# Patient Record
Sex: Female | Born: 1985 | Race: White | Hispanic: No | Marital: Single | Smoking: Never smoker
Health system: Southern US, Community
[De-identification: ages and names within clinical notes are randomized; demographics above are authoritative.]

## PROBLEM LIST (undated history)

## (undated) DIAGNOSIS — E785 Hyperlipidemia, unspecified: Secondary | ICD-10-CM

## (undated) HISTORY — PX: WISDOM TOOTH EXTRACTION: SHX21

## (undated) HISTORY — DX: Hyperlipidemia, unspecified: E78.5

## (undated) HISTORY — PX: OTHER SURGICAL HISTORY: SHX169

---
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
## 2004-03-16 ENCOUNTER — Emergency Department (HOSPITAL_COMMUNITY): Admission: EM | Admit: 2004-03-16 | Discharge: 2004-03-17 | Payer: Self-pay | Admitting: Emergency Medicine

## 2004-03-24 ENCOUNTER — Ambulatory Visit (HOSPITAL_COMMUNITY): Admission: RE | Admit: 2004-03-24 | Discharge: 2004-03-24 | Payer: Self-pay | Admitting: Family Medicine

## 2004-03-31 ENCOUNTER — Ambulatory Visit (HOSPITAL_COMMUNITY): Admission: RE | Admit: 2004-03-31 | Discharge: 2004-03-31 | Payer: Self-pay | Admitting: Family Medicine

## 2004-05-19 ENCOUNTER — Ambulatory Visit: Payer: Self-pay | Admitting: Family Medicine

## 2005-07-21 ENCOUNTER — Ambulatory Visit: Payer: Self-pay | Admitting: Family Medicine

## 2007-05-31 ENCOUNTER — Encounter: Payer: Self-pay | Admitting: Family Medicine

## 2010-06-29 NOTE — Letter (Signed)
Summary: RPC chart  RPC chart   Imported By: Curtis Sites 12/17/2009 10:11:19  _____________________________________________________________________  External Attachment:    Type:   Image     Comment:   External Document

## 2012-12-19 ENCOUNTER — Ambulatory Visit: Payer: Self-pay | Admitting: Obstetrics & Gynecology

## 2013-01-08 ENCOUNTER — Encounter: Payer: Self-pay | Admitting: *Deleted

## 2013-01-08 ENCOUNTER — Ambulatory Visit: Payer: Self-pay | Admitting: Obstetrics & Gynecology

## 2013-01-17 ENCOUNTER — Ambulatory Visit: Payer: Self-pay | Admitting: General Practice

## 2013-01-17 ENCOUNTER — Encounter: Payer: Self-pay | Admitting: General Practice

## 2013-01-17 VITALS — BP 110/73 | HR 88 | Temp 99.1°F | Ht 63.0 in | Wt 177.0 lb

## 2013-01-17 DIAGNOSIS — A499 Bacterial infection, unspecified: Secondary | ICD-10-CM

## 2013-01-17 DIAGNOSIS — B9689 Other specified bacterial agents as the cause of diseases classified elsewhere: Secondary | ICD-10-CM

## 2013-01-17 DIAGNOSIS — N760 Acute vaginitis: Secondary | ICD-10-CM

## 2013-01-17 DIAGNOSIS — L299 Pruritus, unspecified: Secondary | ICD-10-CM

## 2013-01-17 LAB — POCT WET PREP WITH KOH
Trichomonas, UA: NEGATIVE
WBC Wet Prep HPF POC: NEGATIVE
Yeast Wet Prep HPF POC: NEGATIVE

## 2013-01-17 MED ORDER — FLUCONAZOLE 150 MG PO TABS: 150.0000 mg | ORAL_TABLET | Freq: Once | ORAL | Status: DC

## 2013-01-17 MED ORDER — METRONIDAZOLE 500 MG PO TABS: 500.0000 mg | ORAL_TABLET | Freq: Two times a day (BID) | ORAL | Status: DC

## 2013-01-17 NOTE — Progress Notes (Signed)
  Subjective:    Patient ID: Burman Nieves, female    DOB: 08-Nov-1985, 27 y.o.   MRN: [id number]  Vaginal Discharge The patient's primary symptoms include genital itching and a vaginal discharge. The patient's pertinent negatives include no genital lesions, genital rash, missed menses, pelvic pain or vaginal bleeding. This is a new problem. The current episode started in the past 7 days. The problem occurs constantly. The problem has been gradually worsening. The problem affects both sides. She is not pregnant. Pertinent negatives include no abdominal pain, back pain, chills, constipation, dysuria, fever, flank pain, frequency, headaches, hematuria, nausea or rash. The vaginal discharge was yellow, white and thick. There has been no bleeding. She has not been passing clots. She has not been passing tissue. Nothing aggravates the symptoms. She has tried nothing for the symptoms. She is sexually active. No, her partner does not have an STD. She uses nothing for contraception. Her menstrual history has been irregular. There is no history of an abdominal surgery, miscarriage, PID or an STD.   Patient presents today with complaints of vaginal discharge. She reports last menstrual cycle was December 14, 2012.     Review of Systems  Constitutional: Negative for fever and chills.  Respiratory: Negative for chest tightness and shortness of breath.   Cardiovascular: Negative for chest pain and palpitations.  Gastrointestinal: Negative for nausea, abdominal pain and constipation.  Genitourinary: Positive for vaginal discharge. Negative for dysuria, frequency, hematuria, flank pain, difficulty urinating, pelvic pain and missed menses.  Musculoskeletal: Negative for back pain.  Skin: Negative for rash.  Neurological: Negative for dizziness, weakness and headaches.       Objective:   Physical Exam  Constitutional: She is oriented to person, place, and time. She appears well-developed and well-nourished.   Cardiovascular: Normal rate, regular rhythm and normal heart sounds.   Pulmonary/Chest: Effort normal and breath sounds normal.  Abdominal: Soft. Bowel sounds are normal. She exhibits no distension. There is no tenderness.  Neurological: She is alert and oriented to person, place, and time.  Skin: Skin is warm and dry.  Psychiatric: She has a normal mood and affect.   Results for orders placed in visit on 01/17/13  POCT WET PREP WITH KOH      Result Value Range   Trichomonas, UA Negative     Clue Cells Wet Prep HPF POC occ     Epithelial Wet Prep HPF POC many     Yeast Wet Prep HPF POC neg     Bacteria Wet Prep HPF POC mod     RBC Wet Prep HPF POC occ     WBC Wet Prep HPF POC neg           Assessment & Plan:  1. Itching - POCT Wet Prep with KOH  2. Bacterial vaginal infection - metroNIDAZOLE (FLAGYL) 500 MG tablet; Take 1 tablet (500 mg total) by mouth 2 (two) times daily.  Dispense: 14 tablet; Refill: 0  3. Vaginitis and vulvovaginitis, unspecified - fluconazole (DIFLUCAN) 150 MG tablet; Take 1 tablet (150 mg total) by mouth once. May repeat in 3 days  Dispense: 3 tablet; Refill: 0 -discussed proper perineal hygiene -RTO if symptoms worsen or unresolved  -Patient verbalized understanding -Coralie Keens, FNP-C

## 2013-01-17 NOTE — Patient Instructions (Addendum)
Bacterial Vaginosis Bacterial vaginosis (BV) is a vaginal infection where the normal balance of bacteria in the vagina is disrupted. The normal balance is then replaced by an overgrowth of certain bacteria. There are several different kinds of bacteria that can cause BV. BV is the most common vaginal infection in women of childbearing age. CAUSES   The cause of BV is not fully understood. BV develops when there is an increase or imbalance of harmful bacteria.  Some activities or behaviors can upset the normal balance of bacteria in the vagina and put women at increased risk including:  Having a new sex partner or multiple sex partners.  Douching.  Using an intrauterine device (IUD) for contraception.  It is not clear what role sexual activity plays in the development of BV. However, women that have never had sexual intercourse are rarely infected with BV. Women do not get BV from toilet seats, bedding, swimming pools or from touching objects around them.  SYMPTOMS   Grey vaginal discharge.  A fish-like odor with discharge, especially after sexual intercourse.  Itching or burning of the vagina and vulva.  Burning or pain with urination.  Some women have no signs or symptoms at all. DIAGNOSIS  Your caregiver must examine the vagina for signs of BV. Your caregiver will perform lab tests and look at the sample of vaginal fluid through a microscope. They will look for bacteria and abnormal cells (clue cells), a pH test higher than 4.5, and a positive amine test all associated with BV.  RISKS AND COMPLICATIONS   Pelvic inflammatory disease (PID).  Infections following gynecology surgery.  Developing HIV.  Developing herpes virus. TREATMENT  Sometimes BV will clear up without treatment. However, all women with symptoms of BV should be treated to avoid complications, especially if gynecology surgery is planned. Female partners generally do not need to be treated. However, BV may spread  between female sex partners so treatment is helpful in preventing a recurrence of BV.   BV may be treated with antibiotics. The antibiotics come in either pill or vaginal cream forms. Either can be used with nonpregnant or pregnant women, but the recommended dosages differ. These antibiotics are not harmful to the baby.  BV can recur after treatment. If this happens, a second round of antibiotics will often be prescribed.  Treatment is important for pregnant women. If not treated, BV can cause a premature delivery, especially for a pregnant woman who had a premature birth in the past. All pregnant women who have symptoms of BV should be checked and treated.  For chronic reoccurrence of BV, treatment with a type of prescribed gel vaginally twice a week is helpful. HOME CARE INSTRUCTIONS   Finish all medication as directed by your caregiver.  Do not have sex until treatment is completed.  Tell your sexual partner that you have a vaginal infection. They should see their caregiver and be treated if they have problems, such as a mild rash or itching.  Practice safe sex. Use condoms. Only have 1 sex partner. PREVENTION  Basic prevention steps can help reduce the risk of upsetting the natural balance of bacteria in the vagina and developing BV:  Do not have sexual intercourse (be abstinent).  Do not douche.  Use all of the medicine prescribed for treatment of BV, even if the signs and symptoms go away.  Tell your sex partner if you have BV. That way, they can be treated, if needed, to prevent reoccurrence. SEEK MEDICAL CARE IF:     Your symptoms are not improving after 3 days of treatment.  You have increased discharge, pain, or fever. MAKE SURE YOU:   Understand these instructions.  Will watch your condition.  Will get help right away if you are not doing well or get worse. FOR MORE INFORMATION  Division of STD Prevention (DSTDP), Centers for Disease Control and Prevention:  www.cdc.gov/std American [employer] Health Association (ASHA): www.ashastd.org  Document Released: 05/16/2005 Document Revised: 08/08/2011 Document Reviewed: 11/06/2008 [employer] Patient Information 2014 [employer].  

## 2013-05-29 ENCOUNTER — Telehealth: Payer: Self-pay | Admitting: Nurse Practitioner

## 2013-05-29 MED ORDER — FLUCONAZOLE 150 MG PO TABS: 150.0000 mg | ORAL_TABLET | Freq: Once | ORAL | Status: DC

## 2013-05-29 NOTE — Telephone Encounter (Signed)
Done

## 2013-10-09 ENCOUNTER — Other Ambulatory Visit: Payer: Self-pay | Admitting: Nurse Practitioner

## 2013-12-10 ENCOUNTER — Encounter (INDEPENDENT_AMBULATORY_CARE_PROVIDER_SITE_OTHER): Payer: Self-pay

## 2013-12-10 ENCOUNTER — Encounter: Payer: Self-pay | Admitting: Family

## 2013-12-10 ENCOUNTER — Ambulatory Visit (INDEPENDENT_AMBULATORY_CARE_PROVIDER_SITE_OTHER): Payer: BC Managed Care – PPO | Admitting: Family

## 2013-12-10 VITALS — BP 116/76 | HR 85 | Temp 97.7°F | Ht 63.0 in | Wt 189.2 lb

## 2013-12-10 DIAGNOSIS — Z23 Encounter for immunization: Secondary | ICD-10-CM

## 2013-12-10 DIAGNOSIS — Z01419 Encounter for gynecological examination (general) (routine) without abnormal findings: Secondary | ICD-10-CM

## 2013-12-10 DIAGNOSIS — R5383 Other fatigue: Secondary | ICD-10-CM

## 2013-12-10 DIAGNOSIS — R5381 Other malaise: Secondary | ICD-10-CM

## 2013-12-10 DIAGNOSIS — Z124 Encounter for screening for malignant neoplasm of cervix: Secondary | ICD-10-CM

## 2013-12-10 DIAGNOSIS — E785 Hyperlipidemia, unspecified: Secondary | ICD-10-CM | POA: Insufficient documentation

## 2013-12-10 NOTE — Patient Instructions (Addendum)
Health Maintenance, Female A healthy lifestyle and preventative care can promote health and wellness.  Maintain regular health, dental, and eye exams.  Eat a healthy diet. Foods like vegetables, fruits, whole grains, low-fat dairy products, and lean protein foods contain the nutrients you need without too many calories. Decrease your intake of foods high in solid fats, added sugars, and salt. Get information about a proper diet from your caregiver, if necessary.  Regular physical exercise is one of the most important things you can do for your health. Most adults should get at least 150 minutes of moderate-intensity exercise (any activity that increases your heart rate and causes you to sweat) each week. In addition, most adults need muscle-strengthening exercises on 2 or more days a week.   Maintain a healthy weight. The body mass index (BMI) is a screening tool to identify possible weight problems. It provides an estimate of body fat based on height and weight. Your caregiver can help determine your BMI, and can help you achieve or maintain a healthy weight. For adults 20 years and older:  A BMI below 18.5 is considered underweight.  A BMI of 18.5 to 24.9 is normal.  A BMI of 25 to 29.9 is considered overweight.  A BMI of 30 and above is considered obese.  Maintain normal blood lipids and cholesterol by exercising and minimizing your intake of saturated fat. Eat a balanced diet with plenty of fruits and vegetables. Blood tests for lipids and cholesterol should begin at age 41 and be repeated every 5 years. If your lipid or cholesterol levels are high, you are over 50, or you are a high risk for heart disease, you may need your cholesterol levels checked more frequently.Ongoing high lipid and cholesterol levels should be treated with medicines if diet and exercise are not effective.  If you smoke, find out from your caregiver how to quit. If you do not use tobacco, do not start.  Lung  cancer screening is recommended for adults aged 66-80 years who are at high risk for developing lung cancer because of a history of smoking. Yearly low-dose computed tomography (CT) is recommended for people who have at least a 30-pack-year history of smoking and are a current smoker or have quit within the past 15 years. A pack year of smoking is smoking an average of 1 pack of cigarettes a day for 1 year (for example: 1 pack a day for 30 years or 2 packs a day for 15 years). Yearly screening should continue until the smoker has stopped smoking for at least 15 years. Yearly screening should also be stopped for people who develop a health problem that would prevent them from having lung cancer treatment.  If you are pregnant, do not drink alcohol. If you are breastfeeding, be very cautious about drinking alcohol. If you are not pregnant and choose to drink alcohol, do not exceed 1 drink per day. One drink is considered to be 12 ounces (355 mL) of beer, 5 ounces (148 mL) of wine, or 1.5 ounces (44 mL) of liquor.  Avoid use of street drugs. Do not share needles with anyone. Ask for help if you need support or instructions about stopping the use of drugs.  High blood pressure causes heart disease and increases the risk of stroke. Blood pressure should be checked at least every 1 to 2 years. Ongoing high blood pressure should be treated with medicines, if weight loss and exercise are not effective.  If you are 29 to 29  years old, ask your caregiver if you should take aspirin to prevent strokes.  Diabetes screening involves taking a blood sample to check your fasting blood sugar level. This should be done once every 3 years, after age 29, if you are within normal weight and without risk factors for diabetes. Testing should be considered at a younger age or be carried out more frequently if you are overweight and have at least 1 risk factor for diabetes.  Breast cancer screening is essential preventative care  for women. You should practice "breast self-awareness." This means understanding the normal appearance and feel of your breasts and may include breast self-examination. Any changes detected, no matter how small, should be reported to a caregiver. Women in their 20s and 30s should have a clinical breast exam (CBE) by a caregiver as part of a regular health exam every 1 to 3 years. After age 40, women should have a CBE every year. Starting at age 40, women should consider having a mammogram (breast X-ray) every year. Women who have a family history of breast cancer should talk to their caregiver about genetic screening. Women at a high risk of breast cancer should talk to their caregiver about having an MRI and a mammogram every year.  Breast cancer gene (BRCA)-related cancer risk assessment is recommended for women who have family members with BRCA-related cancers. BRCA-related cancers include breast, ovarian, tubal, and peritoneal cancers. Having family members with these cancers may be associated with an increased risk for harmful changes (mutations) in the breast cancer genes BRCA1 and BRCA2. Results of the assessment will determine the need for genetic counseling and BRCA1 and BRCA2 testing.  The Pap test is a screening test for cervical cancer. Women should have a Pap test starting at age 21. Between ages 21 and 29, Pap tests should be repeated every 2 years. Beginning at age 30, you should have a Pap test every 3 years as long as the past 3 Pap tests have been normal. If you had a hysterectomy for a problem that was not cancer or a condition that could lead to cancer, then you no longer need Pap tests. If you are between ages 65 and 70, and you have had normal Pap tests going back 10 years, you no longer need Pap tests. If you have had past treatment for cervical cancer or a condition that could lead to cancer, you need Pap tests and screening for cancer for at least 20 years after your treatment. If Pap  tests have been discontinued, risk factors (such as a new sexual partner) need to be reassessed to determine if screening should be resumed. Some women have medical problems that increase the chance of getting cervical cancer. In these cases, your caregiver may recommend more frequent screening and Pap tests.  The human papillomavirus (HPV) test is an additional test that may be used for cervical cancer screening. The HPV test looks for the virus that can cause the cell changes on the cervix. The cells collected during the Pap test can be tested for HPV. The HPV test could be used to screen women aged 30 years and older, and should be used in women of any age who have unclear Pap test results. After the age of 30, women should have HPV testing at the same frequency as a Pap test.  Colorectal cancer can be detected and often prevented. Most routine colorectal cancer screening begins at the age of 50 and continues through age 75. However, your caregiver may   recommend screening at an earlier age if you have risk factors for colon cancer. On a yearly basis, your caregiver may provide home test kits to check for hidden blood in the stool. Use of a small camera at the end of a tube, to directly examine the colon (sigmoidoscopy or colonoscopy), can detect the earliest forms of colorectal cancer. Talk to your caregiver about this at age 52, when routine screening begins. Direct examination of the colon should be repeated every 5 to 10 years through age 69, unless early forms of pre-cancerous polyps or small growths are found.  Hepatitis C blood testing is recommended for all people born from [birth year] through 1966 and any individual with known risks for hepatitis C.  Practice safe sex. Use condoms and avoid high-risk sexual practices to reduce the spread of sexually transmitted infections (STIs). Sexually active women aged 3 and younger should be checked for Chlamydia, which is a common sexually transmitted infection.  Older women with new or multiple partners should also be tested for Chlamydia. Testing for other STIs is recommended if you are sexually active and at increased risk.  Osteoporosis is a disease in which the bones lose minerals and strength with aging. This can result in serious bone fractures. The risk of osteoporosis can be identified using a bone density scan. Women ages 39 and over and women at risk for fractures or osteoporosis should discuss screening with their caregivers. Ask your caregiver whether you should be taking a calcium supplement or vitamin D to reduce the rate of osteoporosis.  Menopause can be associated with physical symptoms and risks. Hormone replacement therapy is available to decrease symptoms and risks. You should talk to your caregiver about whether hormone replacement therapy is right for you.  Use sunscreen. Apply sunscreen liberally and repeatedly throughout the day. You should seek shade when your shadow is shorter than you. Protect yourself by wearing long sleeves, pants, a wide-brimmed hat, and sunglasses year round, whenever you are outdoors.  Notify your caregiver of new moles or changes in moles, especially if there is a change in shape or color. Also notify your caregiver if a mole is larger than the size of a pencil eraser.  Stay current with your immunizations. Document Released: 11/29/2010 Document Revised: 09/10/2012 Document Reviewed: 04/17/2013 [facility] Patient Information 2015 [place], [place]. This information is not intended to replace advice given to you by your health care provider. Make sure you discuss any questions you have with your health care provider.  Fatigue Fatigue is a feeling of tiredness, lack of energy, lack of motivation, or feeling tired all the time. Having enough rest, good nutrition, and reducing stress will normally reduce fatigue. Consult your caregiver if it persists. The nature of your fatigue will help your caregiver to find out its  cause. The treatment is based on the cause.  CAUSES  There are many causes for fatigue. Most of the time, fatigue can be traced to one or more of your habits or routines. Most causes fit into one or more of three general areas. They are: Lifestyle problems  Sleep disturbances.  Overwork.  Physical exertion.  Unhealthy habits.  Poor eating habits or eating disorders.  Alcohol and/or drug use .  Lack of proper nutrition (malnutrition). Psychological problems  Stress and/or anxiety problems.  Depression.  Grief.  Boredom. Medical Problems or Conditions  Anemia.  Pregnancy.  Thyroid gland problems.  Recovery from major surgery.  Continuous pain.  Emphysema or asthma that is not well controlled  Allergic conditions.  Diabetes.  Infections (such as mononucleosis).  Obesity.  Sleep disorders, such as sleep apnea.  Heart failure or other heart-related problems.  Cancer.  Kidney disease.  Liver disease.  Effects of certain medicines such as antihistamines, cough and cold remedies, prescription pain medicines, heart and blood pressure medicines, drugs used for treatment of cancer, and some antidepressants. SYMPTOMS  The symptoms of fatigue include:   Lack of energy.  Lack of drive (motivation).  Drowsiness.  Feeling of indifference to the surroundings. DIAGNOSIS  The details of how you feel help guide your caregiver in finding out what is causing the fatigue. You will be asked about your present and past health condition. It is important to review all medicines that you take, including prescription and non-prescription items. A thorough exam will be done. You will be questioned about your feelings, habits, and normal lifestyle. Your caregiver may suggest blood tests, urine tests, or other tests to look for common medical causes of fatigue.  TREATMENT  Fatigue is treated by correcting the underlying cause. For example, if you have continuous pain or  depression, treating these causes will improve how you feel. Similarly, adjusting the dose of certain medicines will help in reducing fatigue.  HOME CARE INSTRUCTIONS   Try to get the required amount of good sleep every night.  Eat a healthy and nutritious diet, and drink enough water throughout the day.  Practice ways of relaxing (including yoga or meditation).  Exercise regularly.  Make plans to change situations that cause stress. Act on those plans so that stresses decrease over time. Keep your work and personal routine reasonable.  Avoid street drugs and minimize use of alcohol.  Start taking a daily multivitamin after consulting your caregiver. SEEK MEDICAL CARE IF:   You have persistent tiredness, which cannot be accounted for.  You have fever.  You have unintentional weight loss.  You have headaches.  You have disturbed sleep throughout the night.  You are feeling sad.  You have constipation.  You have dry skin.  You have gained weight.  You are taking any new or different medicines that you suspect are causing fatigue.  You are unable to sleep at night.  You develop any unusual swelling of your legs or other parts of your body. SEEK IMMEDIATE MEDICAL CARE IF:   You are feeling confused.  Your vision is blurred.  You feel faint or pass out.  You develop severe headache.  You develop severe abdominal, pelvic, or back pain.  You develop chest pain, shortness of breath, or an irregular or fast heartbeat.  You are unable to pass a normal amount of urine.  You develop abnormal bleeding such as bleeding from the rectum or you vomit blood.  You have thoughts about harming yourself or committing suicide.  You are worried that you might harm someone else. MAKE SURE YOU:   Understand these instructions.  Will watch your condition.  Will get help right away if you are not doing well or get worse. Document Released: 03/13/2007 Document Revised:  08/08/2011 Document Reviewed: 03/13/2007 [facility] Patient Information 2015 [place], [place]. This information is not intended to replace advice given to you by your health care provider. Make sure you discuss any questions you have with your health care provider.

## 2013-12-10 NOTE — Addendum Note (Signed)
Addended by: Shelbie Ammons on: 12/10/2013 02:29 PM   Modules accepted: Orders

## 2013-12-10 NOTE — Progress Notes (Signed)
   Subjective:    Patient ID: [name], female    DOB: 23-Apr-1986, 28 y.o.   MRN: [id number]  HPI Pt presents to the office for annual physical with pap. Pt has hyperlipidemia, but does not take zocor regularly. States it makes her feel fatigued. Pt states she is willing to try another medication. PT states she does have a family history of heart disease.  Pt c/o of extreme fatigue with joint pain that had been going on for years. Pt states both sisters have been diagnosed with Fibromyalgia. Pt would like labs drawn today.   Review of Systems  Constitutional: Positive for fatigue.  HENT: Negative.   Eyes: Negative.   Respiratory: Negative.  Negative for shortness of breath.   Cardiovascular: Negative.  Negative for palpitations.  Gastrointestinal: Negative.   Endocrine: Negative.   Genitourinary: Negative.   Musculoskeletal: Negative.   Neurological: Negative.  Negative for headaches.  Hematological: Negative.   Psychiatric/Behavioral: Negative.   All other systems reviewed and are negative.      Objective:   Physical Exam  Vitals reviewed. Constitutional: She is oriented to person, place, and time. She appears well-developed and well-nourished. No distress.  HENT:  Head: Normocephalic and atraumatic.  Right Ear: External ear normal.  Mouth/Throat: Oropharynx is clear and moist.  Eyes: Pupils are equal, round, and reactive to light.  Neck: Normal range of motion. Neck supple. No thyromegaly present.  Cardiovascular: Normal rate, regular rhythm, normal heart sounds and intact distal pulses.   No murmur heard. Pulmonary/Chest: Effort normal and breath sounds normal. No respiratory distress. She has no wheezes. Right breast exhibits no inverted nipple, no mass, no nipple discharge, no skin change and no tenderness. Left breast exhibits no inverted nipple, no mass, no nipple discharge, no skin change and no tenderness. Breasts are symmetrical.  Abdominal: Soft. Bowel sounds  are normal. She exhibits no distension. There is no tenderness.  Genitourinary:  Bimanual exam- no adnexal masses or tenderness, ovaries nonpalpable   Cervix parous and pink- No discharge    Musculoskeletal: Normal range of motion. She exhibits no edema and no tenderness.  Neurological: She is alert and oriented to person, place, and time. She has normal reflexes. No cranial nerve deficit.  Skin: Skin is warm and dry.  Psychiatric: She has a normal mood and affect. Her behavior is normal. Judgment and thought content normal.    BP 116/76  Pulse 85  Temp(Src) 97.7 F (36.5 C) (Oral)  Ht $R'5\' 3"'ri$  (1.6 m)  Wt 189 lb 3.2 oz (85.821 kg)  BMI 33.52 kg/m2  LMP 11/01/2013       Assessment & Plan:  1. Hyperlipidemia - Lipid panel; Future  2. Other malaise and fatigue - Arthritis Panel; Future - Anemia Profile B; Future - CMP14+EGFR; Future - Vit D  25 hydroxy (rtn osteoporosis monitoring); Future - Thyroid Panel With TSH; Future  3. Encounter for routine gynecological examination - Pap IG, CT/NG w/ reflex HPV when ASC-U   Continue all meds Labs-Pt to come in next week to have labs drawn-Pt ate before visit  Health Maintenance reviewed Diet and exercise encouraged RTO 1 year  Evelina Dun, FNP

## 2013-12-14 LAB — PAP IG, CT-NG, RFX HPV ASCU
Chlamydia, Nuc. Acid Amp: NEGATIVE
Gonococcus by Nucleic Acid Amp: NEGATIVE
PAP Smear Comment: 0

## 2013-12-16 ENCOUNTER — Telehealth: Payer: Self-pay | Admitting: Family Medicine

## 2013-12-16 ENCOUNTER — Other Ambulatory Visit (INDEPENDENT_AMBULATORY_CARE_PROVIDER_SITE_OTHER): Payer: BC Managed Care – PPO

## 2013-12-16 DIAGNOSIS — R5381 Other malaise: Secondary | ICD-10-CM

## 2013-12-16 DIAGNOSIS — R5383 Other fatigue: Principal | ICD-10-CM

## 2013-12-16 DIAGNOSIS — E785 Hyperlipidemia, unspecified: Secondary | ICD-10-CM

## 2013-12-16 NOTE — Telephone Encounter (Signed)
Pt aware of pap results.

## 2013-12-16 NOTE — Telephone Encounter (Signed)
Message copied by Waverly Ferrari on Mon Dec 16, 2013 10:19 AM ------      Message from: Lenna Gilford, [place] A      Created: Mon Dec 16, 2013  9:49 AM       Pap negative for lesion and malignancy ------

## 2013-12-17 ENCOUNTER — Encounter: Payer: Self-pay | Admitting: Family

## 2013-12-18 LAB — THYROID PANEL WITH TSH
Free Thyroxine Index: 2.1 (ref 1.2–4.9)
T3 Uptake Ratio: 26 % (ref 24–39)
T4, Total: 8.2 ug/dL (ref 4.5–12.0)
TSH: 2.87 u[IU]/mL (ref 0.450–4.500)

## 2013-12-18 LAB — ANEMIA PROFILE B
Basophils Absolute: 0 10*3/uL (ref 0.0–0.2)
Basos: 0 %
Eos: 2 %
Eosinophils Absolute: 0.1 10*3/uL (ref 0.0–0.4)
Ferritin: 51 ng/mL (ref 15–150)
Folate: 16.6 ng/mL (ref >3.0)
HCT: 41.8 % (ref 34.0–46.6)
Hemoglobin: 13.6 g/dL (ref 11.1–15.9)
Immature Grans (Abs): 0 10*3/uL (ref 0.0–0.1)
Immature Granulocytes: 0 %
Iron Saturation: 20 % (ref 15–55)
Iron: 57 ug/dL (ref 35–155)
Lymphocytes Absolute: 1.5 10*3/uL (ref 0.7–3.1)
Lymphs: 28 %
MCH: 29.6 pg (ref 26.6–33.0)
MCHC: 32.5 g/dL (ref 31.5–35.7)
MCV: 91 fL (ref 79–97)
Monocytes Absolute: 0.5 10*3/uL (ref 0.1–0.9)
Monocytes: 8 %
Neutrophils Absolute: 3.3 10*3/uL (ref 1.4–7.0)
Neutrophils Relative %: 62 %
Platelets: 358 10*3/uL (ref 150–379)
RBC: 4.6 x10E6/uL (ref 3.77–5.28)
RDW: 12.8 % (ref 12.3–15.4)
Retic Ct Pct: 1.2 % (ref 0.6–2.6)
TIBC: 288 ug/dL (ref 250–450)
UIBC: 231 ug/dL (ref 150–375)
Vitamin B-12: 707 pg/mL (ref 211–946)
WBC: 5.4 10*3/uL (ref 3.4–10.8)

## 2013-12-18 LAB — CMP14+EGFR
ALT: 17 IU/L (ref 0–32)
AST: 18 IU/L (ref 0–40)
Albumin/Globulin Ratio: 1.4 (ref 1.1–2.5)
Albumin: 4.2 g/dL (ref 3.5–5.5)
Alkaline Phosphatase: 80 IU/L (ref 39–117)
BUN/Creatinine Ratio: 11 (ref 8–20)
BUN: 8 mg/dL (ref 6–20)
CO2: 22 mmol/L (ref 18–29)
Calcium: 9.1 mg/dL (ref 8.7–10.2)
Chloride: 100 mmol/L (ref 97–108)
Creatinine, Ser: 0.75 mg/dL (ref 0.57–1.00)
GFR calc Af Amer: 125 mL/min/{1.73_m2} (ref >59)
GFR calc non Af Amer: 108 mL/min/{1.73_m2} (ref >59)
Globulin, Total: 2.9 g/dL (ref 1.5–4.5)
Glucose: 90 mg/dL (ref 65–99)
Potassium: 4.6 mmol/L (ref 3.5–5.2)
Sodium: 139 mmol/L (ref 134–144)
Total Bilirubin: 0.2 mg/dL (ref 0.0–1.2)
Total Protein: 7.1 g/dL (ref 6.0–8.5)

## 2013-12-18 LAB — LIPID PANEL
Chol/HDL Ratio: 6.6 ratio units — ABNORMAL HIGH (ref 0.0–4.4)
Cholesterol, Total: 210 mg/dL — ABNORMAL HIGH (ref 100–199)
HDL: 32 mg/dL — ABNORMAL LOW (ref >39)
LDL Calculated: 126 mg/dL — ABNORMAL HIGH (ref 0–99)
Triglycerides: 261 mg/dL — ABNORMAL HIGH (ref 0–149)
VLDL Cholesterol Cal: 52 mg/dL — ABNORMAL HIGH (ref 5–40)

## 2013-12-18 LAB — ARTHRITIS PANEL
Rheumatoid fact SerPl-aCnc: 10.9 IU/mL (ref 0.0–13.9)
Sed Rate: 11 mm/hr (ref 0–32)
Uric Acid: 5 mg/dL (ref 2.5–7.1)

## 2013-12-18 LAB — VITAMIN D 25 HYDROXY (VIT D DEFICIENCY, FRACTURES): Vit D, 25-Hydroxy: 22.6 ng/mL — ABNORMAL LOW (ref 30.0–100.0)

## 2013-12-19 ENCOUNTER — Telehealth: Payer: Self-pay | Admitting: Family

## 2013-12-19 NOTE — Telephone Encounter (Signed)
Spoke with patient and advised her that you can get a knot at the injection site and that it can be sore. I advised patient to use some ibuprofen and ice to see if it helps and if not she is going to come in on Saturday am

## 2013-12-20 NOTE — Telephone Encounter (Signed)
Please review labs. 

## 2013-12-23 ENCOUNTER — Telehealth: Payer: Self-pay | Admitting: Family Medicine

## 2013-12-23 ENCOUNTER — Other Ambulatory Visit: Payer: Self-pay | Admitting: Family

## 2013-12-23 ENCOUNTER — Telehealth: Payer: Self-pay | Admitting: Family

## 2013-12-23 DIAGNOSIS — R5381 Other malaise: Secondary | ICD-10-CM

## 2013-12-23 DIAGNOSIS — R5383 Other fatigue: Principal | ICD-10-CM

## 2013-12-23 MED ORDER — PRAVASTATIN SODIUM 40 MG PO TABS: 40.0000 mg | ORAL_TABLET | Freq: Every day | ORAL | Status: DC

## 2013-12-23 MED ORDER — VITAMIN D (ERGOCALCIFEROL) 1.25 MG (50000 UNIT) PO CAPS: 50000.0000 [IU] | ORAL_CAPSULE | ORAL | Status: DC

## 2013-12-23 NOTE — Telephone Encounter (Signed)
Referral to neurology sent per request of pt

## 2013-12-23 NOTE — Telephone Encounter (Signed)
Message copied by Waverly Ferrari on Mon Dec 23, 2013  9:23 AM ------      Message from: Lenna Gilford, [place] A      Created: Mon Dec 23, 2013  9:15 AM       Arthritis Panel (Uric acid and Rheumatiod factor) WNL      Anemia Profile (Iron levels, Vitamin B12, Folate, WBC, Hgb, & Plts)- WNL      Kidney and liver function stable      Cholesterol high- new rx sent to pharmacy-Pt needs to be on low fat diet and pt needs to be on birth control with statin       Thyroid levels WNL      Vit D levels low- RX sent to pharmacy ------

## 2014-01-16 ENCOUNTER — Telehealth: Payer: Self-pay | Admitting: Family

## 2014-01-17 ENCOUNTER — Ambulatory Visit: Payer: Self-pay | Admitting: Neurology

## 2014-01-18 ENCOUNTER — Other Ambulatory Visit: Payer: Self-pay | Admitting: Family

## 2014-01-20 NOTE — Telephone Encounter (Signed)
Last ov 7/15. 

## 2014-01-21 NOTE — Telephone Encounter (Signed)
Patient had went to the urgent care

## 2014-01-30 ENCOUNTER — Telehealth: Payer: Self-pay | Admitting: Family

## 2014-01-30 ENCOUNTER — Telehealth: Payer: Self-pay | Admitting: *Deleted

## 2014-01-30 DIAGNOSIS — M797 Fibromyalgia: Secondary | ICD-10-CM

## 2014-01-30 NOTE — Telephone Encounter (Signed)
Referral to rheumatology placed. Can we expedite this since the patient has been waiting to see a specialist?

## 2014-01-30 NOTE — Telephone Encounter (Signed)
[facility] appointment canceled recommended she see a Rheumatologist instead of a neurologist.

## 2014-01-31 ENCOUNTER — Ambulatory Visit: Payer: Self-pay | Admitting: Neurology

## 2014-02-10 DIAGNOSIS — M545 Low back pain, unspecified: Secondary | ICD-10-CM | POA: Insufficient documentation

## 2014-02-10 DIAGNOSIS — M2550 Pain in unspecified joint: Secondary | ICD-10-CM | POA: Insufficient documentation

## 2014-02-10 DIAGNOSIS — M542 Cervicalgia: Secondary | ICD-10-CM | POA: Insufficient documentation

## 2014-02-10 DIAGNOSIS — M791 Myalgia, unspecified site: Secondary | ICD-10-CM | POA: Insufficient documentation

## 2014-05-08 DIAGNOSIS — R0602 Shortness of breath: Secondary | ICD-10-CM | POA: Insufficient documentation

## 2014-05-08 DIAGNOSIS — R079 Chest pain, unspecified: Secondary | ICD-10-CM | POA: Insufficient documentation

## 2014-09-11 ENCOUNTER — Telehealth: Payer: Self-pay | Admitting: Family

## 2014-09-11 ENCOUNTER — Encounter: Payer: Self-pay | Admitting: Nurse Practitioner

## 2014-09-11 ENCOUNTER — Ambulatory Visit (INDEPENDENT_AMBULATORY_CARE_PROVIDER_SITE_OTHER): Payer: BLUE CROSS/BLUE SHIELD | Admitting: Nurse Practitioner

## 2014-09-11 VITALS — BP 110/74 | HR 78 | Temp 97.1°F | Ht 63.0 in | Wt 191.0 lb

## 2014-09-11 DIAGNOSIS — K921 Melena: Secondary | ICD-10-CM

## 2014-09-11 NOTE — Progress Notes (Signed)
   Subjective:    Patient ID: Kara Irwin, female    DOB: 1986-03-13, 29 y.o.   MRN: [id number]  HPI Patient in c/o bright red blood in stool. Noticed it yesterday. Has had 4 bowel movements in since seeing blood and only saw blood once. Not sure if she has hemorrhoids. When she first saw blood she was straining some to have a BM.    Review of Systems  Constitutional: Negative.   HENT: Negative.   Respiratory: Negative.   Cardiovascular: Negative.   Gastrointestinal: Negative.   Genitourinary: Negative.   Neurological: Negative.   Psychiatric/Behavioral: Negative.   All other systems reviewed and are negative.      Objective:   Physical Exam  Constitutional: She is oriented to person, place, and time. She appears well-developed and well-nourished.  Cardiovascular: Normal rate, regular rhythm and normal heart sounds.   Pulmonary/Chest: Effort normal and breath sounds normal.  Abdominal: Soft.  Genitourinary:  Refuses rectal exam today  Neurological: She is alert and oriented to person, place, and time.  Skin: Skin is warm and dry.  Psychiatric: She has a normal mood and affect. Her behavior is normal. Judgment and thought content normal.    BP 110/74 mmHg  Pulse 78  Temp(Src) 97.1 F (36.2 C) (Oral)  Ht 5\' 3"  (1.6 m)  Wt 191 lb (86.637 kg)  BMI 33.84 kg/m2       Assessment & Plan:   1. Blood in stool    Discussed possible causes with patient with most likely is hemorrhoids- suggested stool softners and monitor occurrences- if continues will refer to GI for colonoscopy.  Mary-Margaret Hassell Done, FNP

## 2014-09-11 NOTE — Telephone Encounter (Signed)
Patient aware she will need to be seen. Appointment given for today 5:45

## 2014-09-12 ENCOUNTER — Encounter: Payer: Self-pay | Admitting: Family Medicine

## 2014-09-12 ENCOUNTER — Ambulatory Visit (INDEPENDENT_AMBULATORY_CARE_PROVIDER_SITE_OTHER): Payer: BLUE CROSS/BLUE SHIELD | Admitting: Family Medicine

## 2014-09-12 VITALS — BP 119/79 | HR 80 | Temp 97.7°F | Ht 63.0 in | Wt 190.0 lb

## 2014-09-12 DIAGNOSIS — K6289 Other specified diseases of anus and rectum: Secondary | ICD-10-CM

## 2014-09-12 MED ORDER — MESALAMINE 1000 MG RE SUPP: 1000.0000 mg | Freq: Two times a day (BID) | RECTAL | Status: DC

## 2014-09-12 NOTE — Progress Notes (Signed)
Subjective:  Patient ID: Kara Irwin, female    DOB: 11-22-1985  Age: 29 y.o. MRN: [id number]  CC: Blood In Stools   HPI Kara Irwin presents for onset 3 days ago of bright red blood in her stool. She verifies that she is not menstruating and that this is not vaginal blood. She has some mild tenesmus. There has been some hard stool recently. Currently the stool this formed and soft. Stool is significantly covered by blood but there is no melena noted. No change in diet. No nausea vomiting or diarrhea. Blood is described as copious. It was sudden in onset with a bowel movement it is not occurring between bowel movements. It has been seen and lesser amounts and the subsequent 2 bowel movements that have occurred prior to this visit.  History Kara Irwin has a past medical history of Hyperlipidemia.   She has past surgical history that includes wisdom teetjh.   Her family history includes Arthritis in her father; Diabetes in her father; Hyperlipidemia in her father and mother; Hypertension in her father and mother.She reports that she has never smoked. She does not have any smokeless tobacco history on file. She reports that she does not drink alcohol or use illicit drugs.  Current Outpatient Prescriptions on File Prior to Visit  Medication Sig Dispense Refill  . Vitamin D, Ergocalciferol, (DRISDOL) 50000 UNITS CAPS capsule Take 1 capsule (50,000 Units total) by mouth every 7 (seven) days. 30 capsule 6   No current facility-administered medications on file prior to visit.    ROS Review of Systems  Constitutional: Negative for fever, chills, diaphoresis, appetite change, fatigue and unexpected weight change.  HENT: Negative for congestion, ear pain, hearing loss, postnasal drip, rhinorrhea, sneezing, sore throat and trouble swallowing.   Eyes: Negative for pain.  Respiratory: Negative for cough, chest tightness and shortness of breath.   Cardiovascular: Negative for chest pain and  palpitations.  Gastrointestinal: Positive for blood in stool, anal bleeding and rectal pain. Negative for nausea, vomiting, abdominal pain, diarrhea and constipation.  Genitourinary: Negative for dysuria, frequency and menstrual problem.  Musculoskeletal: Negative for joint swelling and arthralgias.  Skin: Negative for rash.  Neurological: Negative for dizziness, weakness, numbness and headaches.  Psychiatric/Behavioral: Negative for dysphoric mood and agitation.    Objective:  BP 119/79 mmHg  Pulse 80  Temp(Src) 97.7 F (36.5 C) (Oral)  Ht 5\' 3"  (1.6 m)  Wt 190 lb (86.183 kg)  BMI 33.67 kg/m2  BP Readings from Last 3 Encounters:  09/12/14 119/79  09/11/14 110/74  12/10/13 116/76    Wt Readings from Last 3 Encounters:  09/12/14 190 lb (86.183 kg)  09/11/14 191 lb (86.637 kg)  12/10/13 189 lb 3.2 oz (85.821 kg)     Physical Exam  Constitutional: She is oriented to person, place, and time. She appears well-developed and well-nourished. No distress.  HENT:  Head: Normocephalic and atraumatic.  Right Ear: External ear normal.  Left Ear: External ear normal.  Nose: Nose normal.  Mouth/Throat: Oropharynx is clear and moist.  Eyes: Conjunctivae and EOM are normal. Pupils are equal, round, and reactive to light.  Neck: Normal range of motion. Neck supple. No thyromegaly present.  Cardiovascular: Normal rate, regular rhythm and normal heart sounds.   No murmur heard. Pulmonary/Chest: Effort normal and breath sounds normal. No respiratory distress. She has no wheezes. She has no rales.  Abdominal: Soft. Bowel sounds are normal. She exhibits no distension. There is no tenderness.  Anoscopy performed showing  beefy red irritation of the distal rectum and anal verge. No discrete lesions such as hemorrhoids noted. There is one area suggestive of possible fissure that is superficial  Lymphadenopathy:    She has no cervical adenopathy.  Neurological: She is alert and oriented to  person, place, and time. She has normal reflexes.  Skin: Skin is warm and dry.  Psychiatric: She has a normal mood and affect. Her behavior is normal. Judgment and thought content normal.    No results found for: HGBA1C  Lab Results  Component Value Date   WBC 5.4 12/16/2013   HGB 13.6 12/16/2013   HCT 41.8 12/16/2013   PLT 358 12/16/2013   GLUCOSE 90 12/16/2013   CHOL 210* 12/16/2013   TRIG 261* 12/16/2013   HDL 32* 12/16/2013   LDLCALC 126* 12/16/2013   ALT 17 12/16/2013   AST 18 12/16/2013   NA 139 12/16/2013   K 4.6 12/16/2013   CL 100 12/16/2013   CREATININE 0.75 12/16/2013   BUN 8 12/16/2013   CO2 22 12/16/2013   TSH 2.870 12/16/2013    No results found.  Assessment & Plan:   Kara Irwin was seen today for blood in stools.  Diagnoses and all orders for this visit:  Proctitis  Other orders -     mesalamine (CANASA) 1000 MG suppository; Place 1 suppository (1,000 mg total) rectally 2 (two) times daily.  I am having Ms. Gallentine start on mesalamine. I am also having her maintain her Vitamin D (Ergocalciferol) and Fish Oil.  Meds ordered this encounter  Medications  . Omega-3 Fatty Acids (FISH OIL) 1000 MG CAPS    Sig: Take 1 capsule by mouth.  . mesalamine (CANASA) 1000 MG suppository    Sig: Place 1 suppository (1,000 mg total) rectally 2 (two) times daily.    Dispense:  30 suppository    Refill:  12   Reminded to keep the stool soft with multiple stool softeners, Metamucil and ample water consumption.  Follow-up: Return in about 2 weeks (around 09/26/2014), or Or sooner if symptoms worsen or fail to improve.  Claretta Fraise, M.D.

## 2014-09-12 NOTE — Patient Instructions (Signed)
Colace 100 mg twice a day  Metamucil 1 tablespoon twice a day take regularly until symptoms completely resolved.

## 2014-09-17 ENCOUNTER — Telehealth: Payer: Self-pay | Admitting: Family Medicine

## 2014-09-17 NOTE — Telephone Encounter (Signed)
Patient states that she is severely nauseated, bloated and she feels the urgency to have bowel movements but when she goes it is a very small amount and a lot of times when she goes nothing comes out. Patient wants to know what you advise that she do next.

## 2014-09-17 NOTE — Telephone Encounter (Signed)
She needs to follow-up in the office please

## 2014-09-18 ENCOUNTER — Telehealth: Payer: Self-pay | Admitting: Family Medicine

## 2014-09-18 NOTE — Telephone Encounter (Signed)
Please call our office to schedule an appointment to be seen.

## 2014-09-18 NOTE — Telephone Encounter (Signed)
Appt given per patient request 

## 2014-09-24 ENCOUNTER — Ambulatory Visit (INDEPENDENT_AMBULATORY_CARE_PROVIDER_SITE_OTHER): Payer: BLUE CROSS/BLUE SHIELD | Admitting: Family Medicine

## 2014-09-24 ENCOUNTER — Encounter: Payer: Self-pay | Admitting: Family Medicine

## 2014-09-24 VITALS — BP 116/70 | HR 85 | Temp 98.1°F | Ht 63.0 in | Wt 189.8 lb

## 2014-09-24 DIAGNOSIS — R1012 Left upper quadrant pain: Secondary | ICD-10-CM | POA: Diagnosis not present

## 2014-09-24 LAB — POCT CBC
Granulocyte percent: 66.6 %G (ref 37–80)
HCT, POC: 42.9 % (ref 37.7–47.9)
Hemoglobin: 13.7 g/dL (ref 12.2–16.2)
Lymph, poc: 2.1 (ref 0.6–3.4)
MCH: 28.2 pg (ref 27–31.2)
MCHC: 31.9 g/dL (ref 31.8–35.4)
MCV: 88.4 fL (ref 80–97)
MPV: 7.3 fL (ref 0–99.8)
PLATELET COUNT, POC: 347 10*3/uL (ref 142–424)
POC GRANULOCYTE: 5.1 (ref 2–6.9)
POC LYMPH PERCENT: 26.7 %L (ref 10–50)
RBC: 4.85 M/uL (ref 4.04–5.48)
RDW, POC: 12.7 %
WBC: 7.7 10*3/uL (ref 4.6–10.2)

## 2014-09-24 MED ORDER — PANTOPRAZOLE SODIUM 40 MG PO TBEC: 40.0000 mg | DELAYED_RELEASE_TABLET | Freq: Every day | ORAL | Status: DC

## 2014-09-24 NOTE — Progress Notes (Signed)
Subjective:  Patient ID: Kara Irwin, female    DOB: 07-Aug-1985  Age: 29 y.o. MRN: [id number]  CC: Nausea   HPI Kara Irwin presents for an set 2 days ago of diarrhea which occurred after eating with a tomato sandwich and Kara Irwin at onset she had multiple bouts of diarrhea. This went away after several hours in the evening. Yesterday she did not have diarrhea but she was nauseated. She continues to be nauseated at this time. Of note is that she was here for proctitis couple weeks ago she only took 1 suppository but that gave her the relief she needed and it caused nausea so she discontinued it after 1 dose. She has not had any recurrent rectal bleeding but she did have 4 days of constipation after which she took an Ex-Lax and had normal bowel movements until 2 days ago when she had the diarrhea. Bowels have been normal since that time and again no hematochezia or melena..  History Kara Irwin has a past medical history of Hyperlipidemia.   She has past surgical history that includes wisdom teetjh.   Her family history includes Arthritis in her father; Diabetes in her father; Hyperlipidemia in her father and mother; Hypertension in her father and mother.She reports that she has never smoked. She does not have any smokeless tobacco history on file. She reports that she does not drink alcohol or use illicit drugs.  Current Outpatient Prescriptions on File Prior to Visit  Medication Sig Dispense Refill  . Omega-3 Fatty Acids (FISH OIL) 1000 MG CAPS Take 1 capsule by mouth.    . Vitamin D, Ergocalciferol, (DRISDOL) 50000 UNITS CAPS capsule Take 1 capsule (50,000 Units total) by mouth every 7 (seven) days. 30 capsule 6   No current facility-administered medications on file prior to visit.    ROS Review of Systems  Constitutional: Negative for fever, chills, diaphoresis, appetite change, fatigue and unexpected weight change.  HENT: Negative for congestion, ear pain, hearing loss,  postnasal drip, rhinorrhea, sneezing, sore throat and trouble swallowing.   Eyes: Negative for pain.  Respiratory: Negative for cough, chest tightness and shortness of breath.   Cardiovascular: Negative for chest pain and palpitations.  Gastrointestinal: Positive for nausea and abdominal pain. Negative for vomiting, diarrhea and constipation.  Genitourinary: Negative for dysuria, frequency and menstrual problem.  Musculoskeletal: Negative for joint swelling and arthralgias.  Skin: Negative for rash.  Neurological: Negative for dizziness, weakness, numbness and headaches.  Psychiatric/Behavioral: Negative for dysphoric mood and agitation.    Objective:  BP 116/70 mmHg  Pulse 85  Temp(Src) 98.1 F (36.7 C) (Oral)  Ht _0  (1.6 m)  Wt 189 lb 12.8 oz (86.093 kg)  BMI 33.63 kg/m2  LMP 09/17/2014  BP Readings from Last 3 Encounters:  09/24/14 116/70  09/12/14 119/79  09/11/14 110/74    Wt Readings from Last 3 Encounters:  09/24/14 189 lb 12.8 oz (86.093 kg)  09/12/14 190 lb (86.183 kg)  09/11/14 191 lb (86.637 kg)     Physical Exam  Constitutional: She is oriented to person, place, and time. She appears well-developed and well-nourished. No distress.  HENT:  Head: Normocephalic and atraumatic.  Right Ear: External ear normal.  Left Ear: External ear normal.  Nose: Nose normal.  Mouth/Throat: Oropharynx is clear and moist.  Eyes: Conjunctivae and EOM are normal. Pupils are equal, round, and reactive to light.  Neck: Normal range of motion. Neck supple. No thyromegaly present.  Cardiovascular: Normal rate, regular rhythm and  normal heart sounds.   No murmur heard. Pulmonary/Chest: Effort normal and breath sounds normal. No respiratory distress. She has no wheezes. She has no rales.  Abdominal: Soft. Bowel sounds are normal. She exhibits no distension. There is no tenderness.  Lymphadenopathy:    She has no cervical adenopathy.  Neurological: She is alert and oriented to  person, place, and time. She has normal reflexes.  Skin: Skin is warm and dry.  Psychiatric: She has a normal mood and affect. Her behavior is normal. Judgment and thought content normal.    No results found for: HGBA1C  Lab Results  Component Value Date   WBC 5.4 12/16/2013   HGB 13.6 12/16/2013   HCT 41.8 12/16/2013   PLT 358 12/16/2013   GLUCOSE 90 12/16/2013   CHOL 210* 12/16/2013   TRIG 261* 12/16/2013   HDL 32* 12/16/2013   LDLCALC 126* 12/16/2013   ALT 17 12/16/2013   AST 18 12/16/2013   NA 139 12/16/2013   K 4.6 12/16/2013   CL 100 12/16/2013   CREATININE 0.75 12/16/2013   BUN 8 12/16/2013   CO2 22 12/16/2013   TSH 2.870 12/16/2013    No results found.  Assessment & Plan:   Kara Irwin was seen today for nausea.  Diagnoses and all orders for this visit:  LUQ abdominal pain Orders: -     POCT CBC -     CMP14+EGFR -     Amylase -     Lipase -     US Abdomen Complete; Future  Other orders -     pantoprazole (PROTONIX) 40 MG tablet; Take 1 tablet (40 mg total) by mouth daily.  I have discontinued Kara Irwin's mesalamine. I am also having her start on pantoprazole. Additionally, I am having her maintain her Vitamin D (Ergocalciferol) and Fish Oil.  Meds ordered this encounter  Medications  . pantoprazole (PROTONIX) 40 MG tablet    Sig: Take 1 tablet (40 mg total) by mouth daily.    Dispense:  30 tablet    Refill:  3     Follow-up: No Follow-up on file.  Kara Irwin, M.D.

## 2014-09-25 LAB — CMP14+EGFR
A/G RATIO: 1.4 (ref 1.1–2.5)
ALBUMIN: 4.3 g/dL (ref 3.5–5.5)
ALT: 36 IU/L — ABNORMAL HIGH (ref 0–32)
AST: 27 IU/L (ref 0–40)
Alkaline Phosphatase: 84 IU/L (ref 39–117)
BUN/Creatinine Ratio: 14 (ref 8–20)
BUN: 10 mg/dL (ref 6–20)
Bilirubin Total: <0.2 mg/dL (ref 0.0–1.2)
CALCIUM: 9.4 mg/dL (ref 8.7–10.2)
CO2: 25 mmol/L (ref 18–29)
CREATININE: 0.71 mg/dL (ref 0.57–1.00)
Chloride: 98 mmol/L (ref 97–108)
GFR calc Af Amer: 132 mL/min/{1.73_m2} (ref >59)
GFR, EST NON AFRICAN AMERICAN: 115 mL/min/{1.73_m2} (ref >59)
GLOBULIN, TOTAL: 3 g/dL (ref 1.5–4.5)
Glucose: 95 mg/dL (ref 65–99)
Potassium: 4.5 mmol/L (ref 3.5–5.2)
SODIUM: 137 mmol/L (ref 134–144)
Total Protein: 7.3 g/dL (ref 6.0–8.5)

## 2014-09-25 LAB — LIPASE: Lipase: 29 U/L (ref 0–59)

## 2014-09-25 LAB — AMYLASE: Amylase: 57 U/L (ref 31–124)

## 2014-10-01 ENCOUNTER — Ambulatory Visit (HOSPITAL_COMMUNITY): Payer: BLUE CROSS/BLUE SHIELD

## 2014-10-06 ENCOUNTER — Ambulatory Visit (HOSPITAL_COMMUNITY)
Admission: RE | Admit: 2014-10-06 | Discharge: 2014-10-06 | Disposition: A | Discharge status: Home / Self Care | Payer: BLUE CROSS/BLUE SHIELD | Source: Ambulatory Visit | Attending: Family Medicine | Admitting: Family Medicine

## 2014-10-06 DIAGNOSIS — R1012 Left upper quadrant pain: Secondary | ICD-10-CM | POA: Diagnosis present

## 2014-10-06 DIAGNOSIS — R112 Nausea with vomiting, unspecified: Secondary | ICD-10-CM | POA: Diagnosis not present

## 2014-11-17 ENCOUNTER — Encounter: Payer: BLUE CROSS/BLUE SHIELD | Admitting: Family

## 2015-01-28 ENCOUNTER — Encounter: Payer: Self-pay | Admitting: Family

## 2015-01-28 ENCOUNTER — Ambulatory Visit (INDEPENDENT_AMBULATORY_CARE_PROVIDER_SITE_OTHER): Payer: BLUE CROSS/BLUE SHIELD | Admitting: Family

## 2015-01-28 VITALS — BP 121/79 | HR 84 | Temp 97.4°F | Ht 63.0 in | Wt 195.8 lb

## 2015-01-28 DIAGNOSIS — B373 Candidiasis of vulva and vagina: Secondary | ICD-10-CM

## 2015-01-28 DIAGNOSIS — L298 Other pruritus: Secondary | ICD-10-CM

## 2015-01-28 DIAGNOSIS — B3731 Acute candidiasis of vulva and vagina: Secondary | ICD-10-CM

## 2015-01-28 DIAGNOSIS — N898 Other specified noninflammatory disorders of vagina: Secondary | ICD-10-CM

## 2015-01-28 LAB — POCT URINALYSIS DIPSTICK
Bilirubin, UA: NEGATIVE
GLUCOSE UA: NEGATIVE
KETONES UA: NEGATIVE
Nitrite, UA: NEGATIVE
PROTEIN UA: NEGATIVE
SPEC GRAV UA: 1.01
Urobilinogen, UA: NEGATIVE
pH, UA: 7

## 2015-01-28 LAB — POCT UA - MICROSCOPIC ONLY
Casts, Ur, LPF, POC: NEGATIVE
Crystals, Ur, HPF, POC: NEGATIVE
MUCUS UA: NEGATIVE
YEAST UA: NEGATIVE

## 2015-01-28 LAB — POCT WET PREP (WET MOUNT)

## 2015-01-28 MED ORDER — FLUCONAZOLE 150 MG PO TABS: 150.0000 mg | ORAL_TABLET | ORAL | Status: DC

## 2015-01-28 NOTE — Progress Notes (Signed)
   Subjective:    Patient ID: Kara Irwin, female    DOB: 1985-10-03, 29 y.o.   MRN: [id number]  Pt presents to the office for chronic yeast infections. Pt has seen an urologists and was told her pH was off. Pt has started taking a probiotic daily, but states she gets a yeast infection every month before she starts her menstrual cycle. Pt was given 12 tablets of diflucan. Pt states she took her last diflucan yesterday, but continues to itch and burn. PT denies any urinary symptoms.  Vaginal Itching The patient's primary symptoms include genital itching, a genital odor and vaginal discharge. The patient's pertinent negatives include no genital rash or vaginal bleeding. This is a new problem. The current episode started yesterday. The problem occurs constantly. The problem has been unchanged. The patient is experiencing no pain. Associated symptoms include frequency. Pertinent negatives include no back pain, chills, discolored urine, dysuria, flank pain, headaches, nausea, urgency or vomiting. The vaginal discharge was watery and yellow. Treatments tried: probiotic. The treatment provided mild relief.      Review of Systems  Constitutional: Negative.  Negative for chills.  HENT: Negative.   Eyes: Negative.   Respiratory: Negative.  Negative for shortness of breath.   Cardiovascular: Negative.  Negative for palpitations.  Gastrointestinal: Negative.  Negative for nausea and vomiting.  Endocrine: Negative.   Genitourinary: Positive for frequency and vaginal discharge. Negative for dysuria, urgency and flank pain.  Musculoskeletal: Negative.  Negative for back pain.  Neurological: Negative.  Negative for headaches.  Hematological: Negative.   Psychiatric/Behavioral: Negative.   All other systems reviewed and are negative.      Objective:   Physical Exam  Constitutional: She is oriented to person, place, and time. She appears well-developed and well-nourished. No distress.  HENT:    Head: Normocephalic and atraumatic.  Eyes: Pupils are equal, round, and reactive to light.  Neck: Normal range of motion. Neck supple. No thyromegaly present.  Cardiovascular: Normal rate, regular rhythm, normal heart sounds and intact distal pulses.   No murmur heard. Pulmonary/Chest: Effort normal and breath sounds normal. No respiratory distress. She has no wheezes.  Abdominal: Soft. Bowel sounds are normal. She exhibits no distension. There is no tenderness.  Musculoskeletal: Normal range of motion. She exhibits no edema or tenderness.  Neurological: She is alert and oriented to person, place, and time. She has normal reflexes. No cranial nerve deficit.  Skin: Skin is warm and dry.  Psychiatric: She has a normal mood and affect. Her behavior is normal. Judgment and thought content normal.  Vitals reviewed.     BP 121/79 mmHg  Pulse 84  Temp(Src) 97.4 F (36.3 C) (Oral)  Ht 5\' 3"  (1.6 m)  Wt 195 lb 12.8 oz (88.814 kg)  BMI 34.69 kg/m2  LMP 12/29/2014     Assessment & Plan:  1. Vaginal itching - POCT urinalysis dipstick - POCT UA - Microscopic Only - POCT Wet Prep [facility])  2. Vagina, candidiasis -Keep clean and dry Cotton underwear _Continue probiotic -PT told to wait two days and if she continues to have itching and burning to take one more diflucan -RTO prn- If pt starts having UTI symptoms pt told to call and I would send in RX for UTI - fluconazole (DIFLUCAN) 150 MG tablet; Take 1 tablet (150 mg total) by mouth every 3 (three) days.  Dispense: 3 tablet; Refill: [place], FNP

## 2015-01-28 NOTE — Patient Instructions (Signed)

## 2015-02-05 ENCOUNTER — Telehealth: Payer: Self-pay | Admitting: Family

## 2015-02-06 MED ORDER — SULFAMETHOXAZOLE-TRIMETHOPRIM 800-160 MG PO TABS: 1.0000 | ORAL_TABLET | Freq: Two times a day (BID) | ORAL | Status: DC

## 2015-02-06 NOTE — Telephone Encounter (Signed)
Prescription sent to pharmacy.

## 2015-02-06 NOTE — Telephone Encounter (Signed)
Patient informed via voicemail that prescription called in

## 2015-07-07 ENCOUNTER — Telehealth: Payer: Self-pay | Admitting: Family

## 2015-07-07 NOTE — Telephone Encounter (Signed)
If moved to [place] why does she want referral to [place], [place]?

## 2015-07-07 NOTE — Telephone Encounter (Signed)
Please address

## 2015-08-17 ENCOUNTER — Ambulatory Visit (INDEPENDENT_AMBULATORY_CARE_PROVIDER_SITE_OTHER): Payer: BLUE CROSS/BLUE SHIELD | Admitting: Pediatrics

## 2015-08-17 ENCOUNTER — Encounter: Payer: Self-pay | Admitting: Pediatrics

## 2015-08-17 VITALS — BP 119/77 | HR 86 | Temp 98.6°F | Ht 63.0 in | Wt 195.6 lb

## 2015-08-17 DIAGNOSIS — J029 Acute pharyngitis, unspecified: Secondary | ICD-10-CM

## 2015-08-17 DIAGNOSIS — J069 Acute upper respiratory infection, unspecified: Secondary | ICD-10-CM

## 2015-08-17 NOTE — Progress Notes (Signed)
    Subjective:    Patient ID: Kara Irwin, female    DOB: [birth year], 30 y.o.   MRN: [id number]  CC: Sore Throat and Ear Pain   HPI: Kara Irwin is a 30 y.o. female presenting for Sore Throat and Ear Pain  Feeling slightly nauseated  Started feeling bad over the past 2 days Singing yesterday at church had a sore throat Lots of drainage this morning when she woke up Felt hot this morning Took dayquil  Has had yellow bleb on L tonsil for over a year Several episodes of "tonsilitis" diagnosed at urgent care, treated with antibiotics. She isnt sure if was streptococcal or not.   Depression screen [facility] 2/9 08/17/2015 01/28/2015 09/12/2014 12/10/2013  Decreased Interest 0 3 0 0  Down, Depressed, Hopeless 0 0 0 0  PHQ - 2 Score 0 3 0 0  Altered sleeping - 0 - -  Tired, decreased energy - 1 - -  Change in appetite - 0 - -  Feeling bad or failure about yourself  - 1 - -  Trouble concentrating - 0 - -  Moving slowly or fidgety/restless - 0 - -  Suicidal thoughts - 0 - -  PHQ-9 Score - 5 - -     Relevant past medical, surgical, family and social history reviewed and updated as indicated. Interim medical history since our last visit reviewed. Allergies and medications reviewed and updated.    ROS: Per HPI unless specifically indicated above  History  Smoking status  . Never Smoker   Smokeless tobacco  . Not on file    Past Medical History Patient Active Problem List   Diagnosis Date Noted  . Hyperlipidemia 12/10/2013        Objective:    BP 119/77 mmHg  Pulse 86  Temp(Src) 98.6 F (37 C) (Oral)  Ht 5\' 3"  (1.6 m)  Wt 195 lb 9.6 oz (88.724 kg)  BMI 34.66 kg/m2  Wt Readings from Last 3 Encounters:  08/17/15 195 lb 9.6 oz (88.724 kg)  01/28/15 195 lb 12.8 oz (88.814 kg)  09/24/14 189 lb 12.8 oz (86.093 kg)     Gen: NAD, alert, cooperative with exam, NCAT EYES: EOMI, no scleral injection or icterus ENT:  TMs pearly gray b/l with wide LR, some clear  fluid behind TMs b/l, OP with mild erythema, 49mm yellow nodule on L tonsil, small 1-79mm white areas on R tonsil, exudates vs stones LYMPH: no cervical LAD CV: NRRR, normal S1/S2, no murmur, distal pulses 2+ b/l Resp: CTABL, no wheezes, normal WOB Neuro: Alert and oriented, strength equal b/l UE and LE, coordination grossly normal     Assessment & Plan:    Yarden was seen today for sore throat and ear pain, rapid strep negative. Will f/u culture. Likely due to acute uri, discussed symptomatic care. If yellow bleb on L tonsil does not improve with this illness will refer to ENT. Has been present for over a year. Pt not sure if growing.  Diagnoses and all orders for this visit:  Sore throat -     Rapid strep screen (not at [facility]) -     Culture, Group A Strep  Acute URI   Follow up plan: Return if symptoms worsen or fail to improve.  Assunta Found, MD [facility] 08/17/2015, 9:56 AM

## 2015-08-17 NOTE — Addendum Note (Signed)
Addended by: Eustaquio Maize on: 08/17/2015 11:30 AM   Modules accepted: Orders, SmartSet

## 2015-08-17 NOTE — Patient Instructions (Addendum)
Netipot with distilled water 2-3 times a day to clear out sinuses Or Normal saline nasal spray Flonase steroid nasal spray 2 sprays each side daily Ibuprofen 600mg  three times a day Lots of fluids Allegra daily

## 2015-08-18 ENCOUNTER — Telehealth: Payer: Self-pay | Admitting: Family

## 2015-08-18 NOTE — Telephone Encounter (Signed)
If no better tomorrow needs to come back in and see dr. Evette Doffing

## 2015-08-19 LAB — CULTURE, GROUP A STREP: Strep A Culture: NEGATIVE

## 2015-08-19 NOTE — Telephone Encounter (Signed)
Culture is still not back yet, will be later today or tomorrow, for the strep culture. If negative for strep, a virus such as parainfluenza or or one of the other common URI viruses is most likely the cause though can also be caused by viruses including mono also called EBV virus. If still with symptoms of sore throat after a week and if they arent getting better let me know, then we should probably do more tests. We will call as soon as culture comes back. Keep using chloraseptic spray, hurricane spray, lozenges as needed for sore throat.

## 2015-08-19 NOTE — Telephone Encounter (Signed)
Pt states if she was told by Dr Evette Doffing to call her back if she wasn't doing better. Pt doesn't want to come back in and have to pay another $25 copay.

## 2015-08-19 NOTE — Telephone Encounter (Signed)
Pt aware.

## 2015-08-22 ENCOUNTER — Encounter: Payer: Self-pay | Admitting: Nurse Practitioner

## 2015-08-22 ENCOUNTER — Ambulatory Visit (INDEPENDENT_AMBULATORY_CARE_PROVIDER_SITE_OTHER): Payer: BLUE CROSS/BLUE SHIELD | Admitting: Nurse Practitioner

## 2015-08-22 VITALS — BP 115/81 | HR 99 | Temp 99.4°F | Ht 63.0 in | Wt 195.0 lb

## 2015-08-22 DIAGNOSIS — J36 Peritonsillar abscess: Secondary | ICD-10-CM

## 2015-08-22 DIAGNOSIS — J029 Acute pharyngitis, unspecified: Secondary | ICD-10-CM | POA: Diagnosis not present

## 2015-08-22 DIAGNOSIS — J069 Acute upper respiratory infection, unspecified: Secondary | ICD-10-CM

## 2015-08-22 MED ORDER — FLUCONAZOLE 150 MG PO TABS: 150.0000 mg | ORAL_TABLET | Freq: Once | ORAL | Status: DC

## 2015-08-22 MED ORDER — CEFDINIR 300 MG PO CAPS: 300.0000 mg | ORAL_CAPSULE | Freq: Two times a day (BID) | ORAL | Status: DC

## 2015-08-22 MED ORDER — HYDROCODONE-HOMATROPINE 5-1.5 MG/5ML PO SYRP
5.0000 mL | ORAL_SOLUTION | Freq: Four times a day (QID) | ORAL | Status: DC | PRN
Start: 2015-08-22 — End: 2016-01-11

## 2015-08-22 NOTE — Patient Instructions (Signed)

## 2015-08-22 NOTE — Progress Notes (Signed)
Subjective:    Patient ID: Kara Irwin, female    DOB: Mar 29, 1986, 30 y.o.   MRN: [id number]  HPI  Patient in c/o cough and congestion- says that cough is worse at nigt and is keeping her up. SHe has taken nightquil and still is still coughing all niight. SHe saw Dr. Evette Doffing Monday and ws diagnosed with viral URI- was told to just treat symptoms. WAS not given any rx. Patient said that she has gotten worse since Monday.   Review of Systems  Constitutional: Positive for fever (low grade fever less then 100) and appetite change (decreased).  HENT: Positive for congestion, ear pain, rhinorrhea, sinus pressure, sore throat, trouble swallowing and voice change.   Respiratory: Positive for cough. Negative for shortness of breath.   Cardiovascular: Negative.   Gastrointestinal: Negative.   Genitourinary: Negative.   Neurological: Negative.   Psychiatric/Behavioral: Negative.        Objective:   Physical Exam  Constitutional: She is oriented to person, place, and time. She appears well-developed and well-nourished. No distress.  HENT:  Right Ear: Hearing, tympanic membrane, external ear and ear canal normal.  Left Ear: Hearing, tympanic membrane, external ear and ear canal normal.  Nose: Mucosal edema and rhinorrhea present. Right sinus exhibits no maxillary sinus tenderness and no frontal sinus tenderness. Left sinus exhibits no maxillary sinus tenderness and no frontal sinus tenderness.  Mouth/Throat: Uvula is midline. Posterior oropharyngeal erythema and tonsillar abscesses (white patch on left tonsil and food particles in crater on rigt tonsil) present.    Neck: Normal range of motion. Neck supple.  Cardiovascular: Normal rate and normal heart sounds.   Pulmonary/Chest: Effort normal and breath sounds normal. She has no wheezes. She has no rales.  Deep wet cough  Lymphadenopathy:    She has no cervical adenopathy.  Neurological: She is alert and oriented to person, place, and  time.  Skin: Skin is warm.  Psychiatric: She has a normal mood and affect. Her behavior is normal. Judgment and thought content normal.    BP 115/81 mmHg  Pulse 99  Temp(Src) 99.4 F (37.4 C) (Oral)  Ht 5\' 3"  (1.6 m)  Wt 195 lb (88.451 kg)  BMI 34.55 kg/m2      Assessment & Plan:   1. Sore throat   2. Tonsillar abscess   3. Upper respiratory infection with cough and congestion    Meds ordered this encounter  Medications  . cefdinir (OMNICEF) 300 MG capsule    Sig: Take 1 capsule (300 mg total) by mouth 2 (two) times daily. 1 po BID    Dispense:  20 capsule    Refill:  0    Order Specific Question:  Supervising Provider    Answer:  Chipper Herb [[id number]]  . HYDROcodone-homatropine (HYCODAN) 5-1.5 MG/5ML syrup    Sig: Take 5 mLs by mouth every 6 (six) hours as needed for cough.    Dispense:  120 mL    Refill:  0    Order Specific Question:  Supervising Provider    Answer:  Chipper Herb [[id number]]  . fluconazole (DIFLUCAN) 150 MG tablet    Sig: Take 1 tablet (150 mg total) by mouth once.    Dispense:  1 tablet    Refill:  0    Order Specific Question:  Supervising Provider    Answer:  Chipper Herb [[id number]]   1. Take meds as prescribed 2. Use a cool mist humidifier especially during the winter months and  when heat has been humid. 3. Use saline nose sprays frequently 4. Saline irrigations of the nose can be very helpful if done frequently.  * 4X daily for 1 week*  * Use of a nettie pot can be helpful with this. Follow directions with this* 5. Drink plenty of fluids 6. Keep thermostat turn down low 7.For any cough or congestion  Use plain Mucinex- regular strength or max strength is fine   * Children- consult with Pharmacist for dosing 8. For fever or aces or pains- take tylenol or ibuprofen appropriate for age and weight.  * for fevers greater than 101 orally you may alternate ibuprofen and tylenol every  3 hours.   Mary-Margaret Hassell Done, FNP

## 2015-08-24 LAB — RAPID STREP SCREEN (MED CTR MEBANE ONLY): STREP GP A AG, IA W/REFLEX: NEGATIVE

## 2015-08-24 LAB — CULTURE, GROUP A STREP

## 2015-09-08 LAB — RAPID STREP SCREEN (MED CTR MEBANE ONLY): STREP GP A AG, IA W/REFLEX: NEGATIVE

## 2015-09-08 LAB — CULTURE, GROUP A STREP

## 2015-10-14 ENCOUNTER — Telehealth: Payer: Self-pay | Admitting: Family

## 2015-10-14 DIAGNOSIS — J069 Acute upper respiratory infection, unspecified: Secondary | ICD-10-CM

## 2015-10-14 NOTE — Telephone Encounter (Signed)
This should be brought before either Adonis Brook or Shelah Lewandowsky. Shelah Lewandowsky saw her for sore throat and white patches. If she wants to see if Shelah Lewandowsky can do the referral based off her note than that the possibility, I know she will be back tomorrow but is not we'll get the referral done today anyways. If not Adonis Brook is the provider and this can wait until tomorrow. If she wants to be seen by me today we can get her in, I think I still have some spots in the afternoon schedule and we can discuss options. I cannot do a referral based on what Shelah Lewandowsky had in her note on that day without seen her myself.

## 2015-10-15 NOTE — Telephone Encounter (Signed)
Patient aware that referral has been made.  

## 2015-10-15 NOTE — Telephone Encounter (Signed)
Referral to ENT sent.

## 2015-11-10 DIAGNOSIS — J358 Other chronic diseases of tonsils and adenoids: Secondary | ICD-10-CM | POA: Insufficient documentation

## 2015-11-10 DIAGNOSIS — J0391 Acute recurrent tonsillitis, unspecified: Secondary | ICD-10-CM | POA: Insufficient documentation

## 2016-01-11 ENCOUNTER — Ambulatory Visit (INDEPENDENT_AMBULATORY_CARE_PROVIDER_SITE_OTHER): Payer: BLUE CROSS/BLUE SHIELD

## 2016-01-11 ENCOUNTER — Ambulatory Visit (INDEPENDENT_AMBULATORY_CARE_PROVIDER_SITE_OTHER): Payer: BLUE CROSS/BLUE SHIELD | Admitting: Family Medicine

## 2016-01-11 ENCOUNTER — Encounter: Payer: Self-pay | Admitting: Family Medicine

## 2016-01-11 VITALS — BP 106/67 | HR 74 | Temp 97.6°F | Ht 63.0 in | Wt 169.6 lb

## 2016-01-11 DIAGNOSIS — M79676 Pain in unspecified toe(s): Secondary | ICD-10-CM

## 2016-01-11 DIAGNOSIS — M542 Cervicalgia: Secondary | ICD-10-CM | POA: Diagnosis not present

## 2016-01-11 DIAGNOSIS — M546 Pain in thoracic spine: Secondary | ICD-10-CM

## 2016-01-11 DIAGNOSIS — Z3200 Encounter for pregnancy test, result unknown: Secondary | ICD-10-CM | POA: Diagnosis not present

## 2016-01-11 DIAGNOSIS — M797 Fibromyalgia: Secondary | ICD-10-CM

## 2016-01-11 LAB — PREGNANCY, URINE: Preg Test, Ur: NEGATIVE

## 2016-01-11 MED ORDER — DICLOFENAC SODIUM 75 MG PO TBEC: 75.0000 mg | DELAYED_RELEASE_TABLET | Freq: Two times a day (BID) | ORAL | 2 refills | Status: DC

## 2016-01-11 MED ORDER — CYCLOBENZAPRINE HCL 10 MG PO TABS: 10.0000 mg | ORAL_TABLET | Freq: Every day | ORAL | 1 refills | Status: DC

## 2016-01-11 NOTE — Progress Notes (Signed)
Subjective:  Patient ID: Kara Irwin, female    DOB: 04/10/1986  Age: 30 y.o. MRN: [id number]  CC: Back Pain (neck spasms x 1 mth) and Fibromyalgia   HPI AVNEET ASHMORE presents for 1 month of increasing pain in the right side of her neck posteriorly. This radiates up into her right posterior scalp.  It also radiates down the right side of her back into the thoracic and upper lumbar regions. It interferes with sleep. She had been doing quite well with natural treatments from her rheumatologist for her fibromyalgia until this occurrence. There is no known injury. Pain seems to be slowly increasing in spite of her natural remedies.   History Jasmarie has a past medical history of Hyperlipidemia.   She has a past surgical history that includes wisdom teetjh.   Her family history includes Arthritis in her father; Diabetes in her father; Gout in her father; Hyperlipidemia in her father and mother; Hypertension in her father and mother.She reports that she has never smoked. She has never used smokeless tobacco. She reports that she does not drink alcohol or use drugs.    ROS Review of Systems  Constitutional: Negative for activity change, appetite change and fever.  HENT: Negative for congestion, rhinorrhea and sore throat.   Eyes: Negative for visual disturbance.  Respiratory: Negative for cough and shortness of breath.   Cardiovascular: Negative for chest pain and palpitations.  Gastrointestinal: Negative for abdominal pain, diarrhea and nausea.  Genitourinary: Negative for dysuria.  Musculoskeletal: Positive for arthralgias, back pain and myalgias.       See HPI   Neurological: Positive for numbness (intermittent at hands and feet).    Objective:  BP 106/67 (BP Location: Left Arm, Patient Position: Sitting, Cuff Size: Normal)   Pulse 74   Temp 97.6 F (36.4 C) (Oral)   Ht '5\' 3"'  (1.6 m)   Wt 169 lb 9.6 oz (76.9 kg)   LMP 01/11/2016   SpO2 99%   BMI 30.04 kg/m    BP Readings from Last 3 Encounters:  01/11/16 106/67  08/22/15 115/81  08/17/15 119/77    Wt Readings from Last 3 Encounters:  01/11/16 169 lb 9.6 oz (76.9 kg)  08/22/15 195 lb (88.5 kg)  08/17/15 195 lb 9.6 oz (88.7 kg)     Physical Exam  Constitutional: She is oriented to person, place, and time. She appears well-developed and well-nourished. No distress.  HENT:  Head: Normocephalic and atraumatic.  Eyes: Conjunctivae and EOM are normal. Pupils are equal, round, and reactive to light.  Neck: Normal range of motion. Neck supple.  Cardiovascular: Normal rate, regular rhythm and normal heart sounds.   No murmur heard. Pulmonary/Chest: Effort normal and breath sounds normal. No respiratory distress. She has no wheezes. She has no rales.  Abdominal: Soft. Bowel sounds are normal. She exhibits no distension. There is no tenderness.  Musculoskeletal: She exhibits tenderness (at right cervical and thoracic spinalis;). She exhibits no edema.       Lumbar back: She exhibits tenderness (right l1-2 paraspinal ). She exhibits no deformity and normal pulse.  Neurological: She is alert and oriented to person, place, and time. She has normal reflexes.  Skin: Skin is warm and dry.  Psychiatric: She has a normal mood and affect. Her behavior is normal. Thought content normal.     Lab Results  Component Value Date   WBC 7.7 09/24/2014   HGB 13.7 09/24/2014   HCT 42.9 09/24/2014   PLT 358 12/16/2013  GLUCOSE 95 09/24/2014   CHOL 210 (H) 12/16/2013   TRIG 261 (H) 12/16/2013   HDL 32 (L) 12/16/2013   LDLCALC 126 (H) 12/16/2013   ALT 36 (H) 09/24/2014   AST 27 09/24/2014   NA 137 09/24/2014   K 4.5 09/24/2014   CL 98 09/24/2014   CREATININE 0.71 09/24/2014   BUN 10 09/24/2014   CO2 25 09/24/2014   TSH 2.870 12/16/2013    US Abdomen Complete  Result Date: 10/06/2014 CLINICAL DATA:  Left upper quadrant pain with nausea and vomiting EXAM: ULTRASOUND ABDOMEN COMPLETE COMPARISON:  CT  abdomen pelvis 03/16/2004 FINDINGS: Gallbladder: No gallstones or wall thickening visualized. No sonographic Murphy sign noted. Common bile duct: Diameter: 3.0 mm Liver: No focal lesion identified. Within normal limits in parenchymal echogenicity. IVC: No abnormality visualized. Pancreas: Visualized portion unremarkable. Spleen: Size and appearance within normal limits. Right Kidney: Length: 11.5 cm. Parapelvic cyst versus small extra renal pelvis. Echogenicity within normal limits. No mass or hydronephrosis visualized. Left Kidney: Length: 13.4 mm. Echogenicity within normal limits. No mass or hydronephrosis visualized. Abdominal aorta: No aneurysm visualized. Other findings: None. IMPRESSION: Negative for gallstones.  No acute abnormality. Electronically Signed   By: Franchot Gallo M.D.   On: 10/06/2014 09:47    Assessment & Plan:   Braelynne was seen today for back pain and fibromyalgia.  Diagnoses and all orders for this visit:  Fibromyalgia -     Ambulatory referral to Rheumatology -     CBC with Differential/Platelet -     CMP14+EGFR -     Uric acid -     DG Cervical Spine Complete; Future  Cervicalgia -     Ambulatory referral to Rheumatology -     CBC with Differential/Platelet -     CMP14+EGFR -     Uric acid -     DG Cervical Spine Complete; Future  Right-sided thoracic back pain -     Ambulatory referral to Rheumatology -     CBC with Differential/Platelet -     CMP14+EGFR -     Uric acid -     DG Cervical Spine Complete; Future  Pain of toe, unspecified laterality -     Ambulatory referral to Rheumatology -     CBC with Differential/Platelet -     CMP14+EGFR -     Uric acid -     DG Cervical Spine Complete; Future  Possible pregnancy, not yet confirmed -     POCT urine pregnancy  Other orders -     cyclobenzaprine (FLEXERIL) 10 MG tablet; Take 1 tablet (10 mg total) by mouth at bedtime. -     diclofenac (VOLTAREN) 75 MG EC tablet; Take 1 tablet (75 mg total) by  mouth 2 (two) times daily.      I have discontinued Ms. Barto's pantoprazole, cefdinir, HYDROcodone-homatropine, and fluconazole. I am also having her start on cyclobenzaprine and diclofenac. Additionally, I am having her maintain her Fish Oil, OVER THE COUNTER MEDICATION, and ibuprofen.  Meds ordered this encounter  Medications  . ibuprofen (ADVIL,MOTRIN) 800 MG tablet    Sig: Take 800 mg by mouth every 8 (eight) hours as needed.  . cyclobenzaprine (FLEXERIL) 10 MG tablet    Sig: Take 1 tablet (10 mg total) by mouth at bedtime.    Dispense:  60 tablet    Refill:  1  . diclofenac (VOLTAREN) 75 MG EC tablet    Sig: Take 1 tablet (75 mg total) by mouth 2 (  two) times daily.    Dispense:  60 tablet    Refill:  2     Follow-up: Return if symptoms worsen or fail to improve.  Claretta Fraise, M.D.

## 2016-01-12 LAB — CBC WITH DIFFERENTIAL/PLATELET
Basophils Absolute: 0 x10E3/uL (ref 0.0–0.2)
Basos: 0 %
EOS (ABSOLUTE): 0.1 x10E3/uL (ref 0.0–0.4)
Eos: 2 %
Hematocrit: 41.8 % (ref 34.0–46.6)
Hemoglobin: 14 g/dL (ref 11.1–15.9)
Immature Grans (Abs): 0 x10E3/uL (ref 0.0–0.1)
Immature Granulocytes: 0 %
Lymphocytes Absolute: 1.7 x10E3/uL (ref 0.7–3.1)
Lymphs: 32 %
MCH: 29.5 pg (ref 26.6–33.0)
MCHC: 33.5 g/dL (ref 31.5–35.7)
MCV: 88 fL (ref 79–97)
Monocytes Absolute: 0.5 x10E3/uL (ref 0.1–0.9)
Monocytes: 9 %
Neutrophils Absolute: 3 x10E3/uL (ref 1.4–7.0)
Neutrophils: 57 %
Platelets: 301 x10E3/uL (ref 150–379)
RBC: 4.75 x10E6/uL (ref 3.77–5.28)
RDW: 13.5 % (ref 12.3–15.4)
WBC: 5.3 x10E3/uL (ref 3.4–10.8)

## 2016-01-12 LAB — CMP14+EGFR
A/G RATIO: 1.5 (ref 1.2–2.2)
ALBUMIN: 4.3 g/dL (ref 3.5–5.5)
ALT: 30 IU/L (ref 0–32)
AST: 19 IU/L (ref 0–40)
Alkaline Phosphatase: 67 IU/L (ref 39–117)
BILIRUBIN TOTAL: 0.3 mg/dL (ref 0.0–1.2)
BUN/Creatinine Ratio: 20 (ref 9–23)
BUN: 13 mg/dL (ref 6–20)
CHLORIDE: 99 mmol/L (ref 96–106)
CO2: 23 mmol/L (ref 18–29)
Calcium: 9.6 mg/dL (ref 8.7–10.2)
Creatinine, Ser: 0.65 mg/dL (ref 0.57–1.00)
GFR calc non Af Amer: 119 mL/min/{1.73_m2} (ref >59)
GFR, EST AFRICAN AMERICAN: 137 mL/min/{1.73_m2} (ref >59)
GLOBULIN, TOTAL: 2.8 g/dL (ref 1.5–4.5)
Glucose: 96 mg/dL (ref 65–99)
POTASSIUM: 5.2 mmol/L (ref 3.5–5.2)
SODIUM: 138 mmol/L (ref 134–144)
TOTAL PROTEIN: 7.1 g/dL (ref 6.0–8.5)

## 2016-01-12 LAB — URIC ACID: Uric Acid: 5.5 mg/dL (ref 2.5–7.1)

## 2016-04-04 ENCOUNTER — Telehealth: Payer: Self-pay

## 2016-04-04 DIAGNOSIS — D229 Melanocytic nevi, unspecified: Secondary | ICD-10-CM

## 2016-04-04 NOTE — Telephone Encounter (Signed)
Referral placed, patient notified.

## 2016-04-04 NOTE — Telephone Encounter (Signed)
Please refer as requested 

## 2016-09-03 ENCOUNTER — Encounter: Payer: Self-pay | Admitting: Pediatrics

## 2016-09-03 ENCOUNTER — Other Ambulatory Visit: Payer: BLUE CROSS/BLUE SHIELD

## 2016-09-03 ENCOUNTER — Ambulatory Visit (INDEPENDENT_AMBULATORY_CARE_PROVIDER_SITE_OTHER): Payer: BLUE CROSS/BLUE SHIELD | Admitting: Pediatrics

## 2016-09-03 VITALS — BP 102/58 | HR 75 | Temp 96.8°F | Ht 63.0 in | Wt 154.6 lb

## 2016-09-03 DIAGNOSIS — M797 Fibromyalgia: Secondary | ICD-10-CM

## 2016-09-03 DIAGNOSIS — R350 Frequency of micturition: Secondary | ICD-10-CM | POA: Diagnosis not present

## 2016-09-03 DIAGNOSIS — M546 Pain in thoracic spine: Secondary | ICD-10-CM | POA: Diagnosis not present

## 2016-09-03 MED ORDER — IBUPROFEN 800 MG PO TABS: 800.0000 mg | ORAL_TABLET | Freq: Three times a day (TID) | ORAL | 1 refills | Status: DC | PRN

## 2016-09-03 MED ORDER — CYCLOBENZAPRINE HCL 10 MG PO TABS: 10.0000 mg | ORAL_TABLET | Freq: Every day | ORAL | 1 refills | Status: DC

## 2016-09-03 NOTE — Progress Notes (Signed)
  Subjective:   Patient ID: Kara Irwin, female    DOB: 30-Dec-1985, 31 y.o.   MRN: [id number] CC: Back Pain (low ) and Urinary Frequency  HPI: Kara Irwin is a 31 y.o. female presenting for Back Pain (low ) and Urinary Frequency  No dysuria No fevers, vomiting, abd pain Hurting in the middle of her back both sides Has h/o fibromyalgia In some ways is similar to her fibromyalgia flares Has been using the bathroom more often she thinks Deep breathing exercises have helped a lot recently with fibro symptoms Is trying to avoid medications, rarely takes flexeril, only at night No change in activities recently, doesn't remember her hurting her back  Relevant past medical, surgical, family and social history reviewed. Allergies and medications reviewed and updated. History  Smoking Status  . Never Smoker  Smokeless Tobacco  . Never Used   ROS: Per HPI   Objective:    BP (!) 102/58   Pulse 75   Temp (!) 96.8 F (36 C) (Oral)   Ht 5\' 3"  (1.6 m)   Wt 154 lb 9.6 oz (70.1 kg)   BMI 27.39 kg/m   Wt Readings from Last 3 Encounters:  09/03/16 154 lb 9.6 oz (70.1 kg)  01/11/16 169 lb 9.6 oz (76.9 kg)  08/22/15 195 lb (88.5 kg)    Gen: NAD, alert, cooperative with exam, NCAT EYES: EOMI, no conjunctival injection, or no icterus ENT:  TMs pearly gray b/l, OP without erythema LYMPH: no cervical LAD CV: NRRR, normal S1/S2, no murmur, distal pulses 2+ b/l Resp: CTABL, no wheezes, normal WOB Abd: +BS, soft, NTND. no guarding or organomegaly Ext: No edema, warm Neuro: Alert and oriented, strength equal b/l UE and LE, coordination grossly normal MSK: some CVA tenderness, paraspinal muscles midback tender with gentle palpation, no point tenderness over spine  Assessment & Plan:  Kara Irwin was seen today for back pain and urinary frequency.  Diagnoses and all orders for this visit:  Urine frequency UA normal f/u culture -     Urine culture -      Urinalysis  Fibromyalgia Discussed regular exercise Use ibuprofen for mid back pain over flexeril Discussed return precautions -     cyclobenzaprine (FLEXERIL) 10 MG tablet; Take 1 tablet (10 mg total) by mouth at bedtime. -     ibuprofen (ADVIL,MOTRIN) 800 MG tablet; Take 1 tablet (800 mg total) by mouth every 8 (eight) hours as needed.   Follow up plan: 8 weeks Assunta Found, MD [place]

## 2016-09-04 LAB — URINE CULTURE

## 2016-09-05 LAB — URINALYSIS
BILIRUBIN UA: NEGATIVE
Glucose, UA: NEGATIVE
Ketones, UA: NEGATIVE
Leukocytes, UA: NEGATIVE
NITRITE UA: NEGATIVE
PH UA: 7 (ref 5.0–7.5)
PROTEIN UA: NEGATIVE
RBC UA: NEGATIVE
SPEC GRAV UA: 1.01 (ref 1.005–1.030)
Urobilinogen, Ur: 0.2 mg/dL (ref 0.2–1.0)

## 2016-10-06 ENCOUNTER — Encounter: Payer: Self-pay | Admitting: Pediatrics

## 2016-10-06 ENCOUNTER — Ambulatory Visit (INDEPENDENT_AMBULATORY_CARE_PROVIDER_SITE_OTHER): Payer: BLUE CROSS/BLUE SHIELD | Admitting: Pediatrics

## 2016-10-06 VITALS — BP 117/77 | HR 89 | Temp 98.2°F | Resp 18 | Ht 63.0 in | Wt 152.4 lb

## 2016-10-06 DIAGNOSIS — J3089 Other allergic rhinitis: Secondary | ICD-10-CM

## 2016-10-06 DIAGNOSIS — J069 Acute upper respiratory infection, unspecified: Secondary | ICD-10-CM | POA: Diagnosis not present

## 2016-10-06 DIAGNOSIS — J029 Acute pharyngitis, unspecified: Secondary | ICD-10-CM

## 2016-10-06 DIAGNOSIS — J329 Chronic sinusitis, unspecified: Secondary | ICD-10-CM

## 2016-10-06 LAB — CULTURE, GROUP A STREP

## 2016-10-06 LAB — RAPID STREP SCREEN (MED CTR MEBANE ONLY): Strep Gp A Ag, IA W/Reflex: NEGATIVE

## 2016-10-06 MED ORDER — CEFDINIR 300 MG PO CAPS: 300.0000 mg | ORAL_CAPSULE | Freq: Two times a day (BID) | ORAL | 0 refills | Status: AC

## 2016-10-06 MED ORDER — CETIRIZINE HCL 10 MG PO TABS: 10.0000 mg | ORAL_TABLET | Freq: Every day | ORAL | 11 refills | Status: AC

## 2016-10-06 NOTE — Progress Notes (Signed)
  Subjective:   Patient ID: Kara Irwin, female    DOB: [birth year], 32 y.o.   MRN: [id number] CC: Nasal Congestion (Taking allegra, nyquil); Ear Pain; and Sore Throat  HPI: Kara Irwin is a 31 y.o. female presenting for Nasal Congestion (Taking allegra, nyquil); Ear Pain; and Sore Throat  Taking allegra daily nyquil helping her sleep Coughing up phelgm, bothered by the cough No fever Ears feel stopped up Appetite is ok No abd pain Throat hurts when swallow past 3 days  Relevant past medical, surgical, family and social history reviewed. Allergies and medications reviewed and updated. History  Smoking Status  . Never Smoker  Smokeless Tobacco  . Never Used   ROS: Per HPI   Objective:    BP 117/77   Pulse 89   Temp 98.2 F (36.8 C) (Oral)   Resp 18   Ht 5\' 3"  (1.6 m)   Wt 152 lb 6.4 oz (69.1 kg)   SpO2 99%   BMI 27.00 kg/m   Wt Readings from Last 3 Encounters:  10/06/16 152 lb 6.4 oz (69.1 kg)  09/03/16 154 lb 9.6 oz (70.1 kg)  01/11/16 169 lb 9.6 oz (76.9 kg)    Gen: NAD, alert, cooperative with exam, NCAT EYES: EOMI, no conjunctival injection, or no icterus ENT:  TMs gray with clear effusion b/l, OP with mild erythema LYMPH: no cervical LAD CV: NRRR, normal S1/S2, no murmur, distal pulses 2+ b/l Resp: CTABL, no wheezes, normal WOB Ext: No edema, warm Neuro: Alert and oriented  Assessment & Plan:  Kara Irwin was seen today for nasal congestion, ear pain, and sore throat.  Diagnoses and all orders for this visit:  Allergic rhinitis due to other allergic trigger, unspecified seasonality Starts getting similar symptoms yearly Start below -     cetirizine (ZYRTEC) 10 MG tablet; Take 1 tablet (10 mg total) by mouth daily.  Acute URI Sinusitis, unspecified chronicity, unspecified location Discussed symptom care for URI, sinus pressure with sinus rinses. If not imrpoving over the weekend and sinus pressure ongoing can start below -     cefdinir  (OMNICEF) 300 MG capsule; Take 1 capsule (300 mg total) by mouth 2 (two) times daily. 1 po BID  Sore throat Rapid negative, f/u culture -     Rapid strep screen (not at [facility]) -     Culture, Group A Strep  Other orders -     Culture, Group A Strep   Follow up plan: As needed Assunta Found, MD [place]

## 2016-10-06 NOTE — Patient Instructions (Addendum)
Netipot with distilled water 2-3 times a day to clear out sinuses Or Normal saline nasal spray Flonase steroid nasal spray Antihistamine daily such as cetirizine Ibuprofen 600mg  three times a day Lots of fluids  If worsening over the weekend OK to start antbiotic

## 2016-10-09 LAB — CULTURE, GROUP A STREP: STREP A CULTURE: NEGATIVE

## 2016-10-11 ENCOUNTER — Telehealth: Payer: Self-pay | Admitting: Pediatrics

## 2016-10-11 NOTE — Telephone Encounter (Signed)
Please advise and route to pools.

## 2016-10-11 NOTE — Telephone Encounter (Signed)
Patient still has chest congestion, coughing up phlegm sinus pressure,  She is feeling better but would like for [place] to send in a prescription of Prednisone to see if it would help.  Patient using [facility].

## 2016-10-11 NOTE — Telephone Encounter (Signed)
Dr. Evette Doffing will want her to be seen for that

## 2016-10-11 NOTE — Telephone Encounter (Signed)
Pt notified of recommendation Declined appt Will call back to schedule

## 2017-05-15 DIAGNOSIS — Z8742 Personal history of other diseases of the female genital tract: Secondary | ICD-10-CM | POA: Insufficient documentation

## 2017-05-16 DIAGNOSIS — Z2839 Other underimmunization status: Secondary | ICD-10-CM | POA: Insufficient documentation

## 2017-10-10 ENCOUNTER — Telehealth: Payer: Self-pay | Admitting: Family Medicine

## 2017-10-10 NOTE — Telephone Encounter (Signed)
Aware that she received tdap on 12/10/2013

## 2018-09-27 ENCOUNTER — Ambulatory Visit (INDEPENDENT_AMBULATORY_CARE_PROVIDER_SITE_OTHER): Payer: BLUE CROSS/BLUE SHIELD | Admitting: Family Medicine

## 2018-09-27 ENCOUNTER — Other Ambulatory Visit: Payer: Self-pay

## 2018-09-27 DIAGNOSIS — K589 Irritable bowel syndrome without diarrhea: Secondary | ICD-10-CM | POA: Diagnosis not present

## 2018-09-27 MED ORDER — HYOSCYAMINE SULFATE 0.125 MG PO TBDP: 0.1250 mg | ORAL_TABLET | ORAL | 5 refills | Status: DC | PRN

## 2018-09-27 NOTE — Progress Notes (Signed)
Subjective:    Patient ID: Kara Irwin, female    DOB: 1986-05-25, 34 y.o.   MRN: [id number]   HPI: Kara Irwin is a 33 y.o. female presenting for 3 weeks ago had diarrhea spell. Felt severe cramping. Felt fluttering under left breast for a few days. Then felt jumping at the top of abd under breast. Came back last night felt it again and saw it jumping. Awakened her.  Felt like she couldn't breathe and panicked. Lasted for an hour. Heart rate was 72. Lots of stress. Mom just had surgery for colon cancer.    Depression screen [facility] 2/9 10/06/2016 09/03/2016 01/11/2016 08/17/2015 01/28/2015  Decreased Interest 0 0 3 0 3  Down, Depressed, Hopeless 0 0 2 0 0  PHQ - 2 Score 0 0 5 0 3  Altered sleeping - - 1 - 0  Tired, decreased energy - - 3 - 1  Change in appetite - - 0 - 0  Feeling bad or failure about yourself  - - 2 - 1  Trouble concentrating - - 0 - 0  Moving slowly or fidgety/restless - - 0 - 0  Suicidal thoughts - - 0 - 0  PHQ-9 Score - - 11 - 5  Difficult doing work/chores - - Somewhat difficult - -     Relevant past medical, surgical, family and social history reviewed and updated as indicated.  Interim medical history since our last visit reviewed. Allergies and medications reviewed and updated.  ROS:  Review of Systems  Constitutional: Negative for fever.  HENT: Negative for congestion, rhinorrhea and sore throat.   Respiratory: Negative for cough and shortness of breath.   Cardiovascular: Positive for palpitations. Negative for chest pain.  Gastrointestinal: Positive for abdominal pain and diarrhea.  Musculoskeletal: Negative for arthralgias and myalgias.     Social History   Tobacco Use  Smoking Status Never Smoker  Smokeless Tobacco Never Used       Objective:     Wt Readings from Last 3 Encounters:  10/06/16 152 lb 6.4 oz (69.1 kg)  09/03/16 154 lb 9.6 oz (70.1 kg)  01/11/16 169 lb 9.6 oz (76.9 kg)     Exam deferred. Pt. Harboring due to COVID  19. Phone visit performed.   Assessment & Plan:   1. Colon spasm     Meds ordered this encounter  Medications  . hyoscyamine (ANASPAZ) 0.125 MG TBDP disintergrating tablet    Sig: Place 1 tablet (0.125 mg total) under the tongue every 4 (four) hours as needed. For colon spasm    Dispense:  50 tablet    Refill:  5    No orders of the defined types were placed in this encounter.     Diagnoses and all orders for this visit:  Colon spasm  Other orders -     hyoscyamine (ANASPAZ) 0.125 MG TBDP disintergrating tablet; Place 1 tablet (0.125 mg total) under the tongue every 4 (four) hours as needed. For colon spasm    Virtual Visit via telephone Note  I discussed the limitations, risks, security and privacy concerns of performing an evaluation and management service by telephone and the availability of in person appointments. The patient was identified with two identifiers. Pt.expressed understanding and agreed to proceed. Pt. Is at home. Dr. Livia Snellen is in his office.  Follow Up Instructions:   I discussed the assessment and treatment plan with the patient. The patient was provided an opportunity to ask questions and all were answered.  The patient agreed with the plan and demonstrated an understanding of the instructions.   The patient was advised to call back or seek an in-person evaluation if the symptoms worsen or if the condition fails to improve as anticipated.  Visit started: 2:43 Call ended:  2:59 Total minutes including chart review and phone contact time: 22   Follow up plan: No follow-ups on file.  Claretta Fraise, MD [place]

## 2018-09-30 ENCOUNTER — Encounter: Payer: Self-pay | Admitting: Family Medicine

## 2018-10-30 ENCOUNTER — Encounter: Payer: Self-pay | Admitting: Family Medicine

## 2018-10-30 ENCOUNTER — Telehealth (INDEPENDENT_AMBULATORY_CARE_PROVIDER_SITE_OTHER): Payer: BC Managed Care – PPO | Admitting: Family Medicine

## 2018-10-30 ENCOUNTER — Other Ambulatory Visit: Payer: Self-pay

## 2018-10-30 DIAGNOSIS — K279 Peptic ulcer, site unspecified, unspecified as acute or chronic, without hemorrhage or perforation: Secondary | ICD-10-CM

## 2018-10-30 MED ORDER — PANTOPRAZOLE SODIUM 40 MG PO TBEC: 40.0000 mg | DELAYED_RELEASE_TABLET | Freq: Every day | ORAL | 11 refills | Status: DC

## 2018-10-30 NOTE — Progress Notes (Signed)
Subjective:    Patient ID: Kara Irwin, female    DOB: [birth year], 33 y.o.   MRN: [id number]   HPI: Kara Irwin is a 33 y.o. female presenting for 1-2 weeks ago had fluttering in stomach. It went away. Then this morning palpitations recurred. No relief with anaspaz. Worse when she feels hungry. Eating a lot of spicy food. Getting a lot of gas. Denies melena. Using keto diet. Went off for a few days. When she went back on her symptoms returned.   Depression screen [facility] 2/9 10/06/2016 09/03/2016 01/11/2016 08/17/2015 01/28/2015  Decreased Interest 0 0 3 0 3  Down, Depressed, Hopeless 0 0 2 0 0  PHQ - 2 Score 0 0 5 0 3  Altered sleeping - - 1 - 0  Tired, decreased energy - - 3 - 1  Change in appetite - - 0 - 0  Feeling bad or failure about yourself  - - 2 - 1  Trouble concentrating - - 0 - 0  Moving slowly or fidgety/restless - - 0 - 0  Suicidal thoughts - - 0 - 0  PHQ-9 Score - - 11 - 5  Difficult doing work/chores - - Somewhat difficult - -     Relevant past medical, surgical, family and social history reviewed and updated as indicated.  Interim medical history since our last visit reviewed. Allergies and medications reviewed and updated.  ROS:  Review of Systems  Constitutional: Negative.   HENT: Negative.   Eyes: Negative for visual disturbance.  Respiratory: Negative for shortness of breath.   Cardiovascular: Negative for chest pain.  Gastrointestinal: Positive for abdominal distention and abdominal pain. Negative for blood in stool, constipation, diarrhea, nausea and vomiting.  Musculoskeletal: Positive for myalgias (fibromyalgia related. Improved while on keto diet.). Negative for arthralgias.     Social History   Tobacco Use  Smoking Status Never Smoker  Smokeless Tobacco Never Used       Objective:     Wt Readings from Last 3 Encounters:  10/06/16 152 lb 6.4 oz (69.1 kg)  09/03/16 154 lb 9.6 oz (70.1 kg)  01/11/16 169 lb 9.6 oz (76.9 kg)      Exam deferred. Pt. Harboring due to COVID 19. Phone visit performed.   Assessment & Plan:   1. PUD (peptic ulcer disease)     Meds ordered this encounter  Medications  . pantoprazole (PROTONIX) 40 MG tablet    Sig: Take 1 tablet (40 mg total) by mouth daily. For stomach    Dispense:  30 tablet    Refill:  11    No orders of the defined types were placed in this encounter.     Diagnoses and all orders for this visit:  PUD (peptic ulcer disease)  Other orders -     pantoprazole (PROTONIX) 40 MG tablet; Take 1 tablet (40 mg total) by mouth daily. For stomach    Virtual Visit via telephone Note  I discussed the limitations, risks, security and privacy concerns of performing an evaluation and management service by telephone and the availability of in person appointments. The patient was identified with two identifiers. Pt.expressed understanding and agreed to proceed. Pt. Is at home. Dr. Livia Snellen is in his office.  Follow Up Instructions:   I discussed the assessment and treatment plan with the patient. The patient was provided an opportunity to ask questions and all were answered. The patient agreed with the plan and demonstrated an understanding of the instructions.  The patient was advised to call back or seek an in-person evaluation if the symptoms worsen or if the condition fails to improve as anticipated.   Total minutes including chart review and phone contact time: 26   Follow up plan: Return in about 1 month (around 11/29/2018), or if symptoms worsen or fail to improve.  Claretta Fraise, MD [place]

## 2019-06-26 ENCOUNTER — Other Ambulatory Visit: Payer: Self-pay

## 2019-06-28 ENCOUNTER — Ambulatory Visit: Payer: BC Managed Care – PPO | Admitting: Family Medicine

## 2019-06-28 ENCOUNTER — Other Ambulatory Visit: Payer: Self-pay

## 2019-06-28 ENCOUNTER — Encounter: Payer: Self-pay | Admitting: Family Medicine

## 2019-06-28 ENCOUNTER — Ambulatory Visit (INDEPENDENT_AMBULATORY_CARE_PROVIDER_SITE_OTHER): Payer: BC Managed Care – PPO

## 2019-06-28 VITALS — BP 112/70 | HR 85 | Temp 98.0°F | Ht 63.0 in | Wt 188.6 lb

## 2019-06-28 DIAGNOSIS — M542 Cervicalgia: Secondary | ICD-10-CM

## 2019-06-28 DIAGNOSIS — M545 Low back pain, unspecified: Secondary | ICD-10-CM

## 2019-06-28 DIAGNOSIS — R399 Unspecified symptoms and signs involving the genitourinary system: Secondary | ICD-10-CM | POA: Diagnosis not present

## 2019-06-28 LAB — URINALYSIS, COMPLETE
Bilirubin, UA: NEGATIVE
Glucose, UA: NEGATIVE
Ketones, UA: NEGATIVE
Leukocytes,UA: NEGATIVE
Nitrite, UA: NEGATIVE
Protein,UA: NEGATIVE
RBC, UA: NEGATIVE
Specific Gravity, UA: 1.02 (ref 1.005–1.030)
Urobilinogen, Ur: 0.2 mg/dL (ref 0.2–1.0)
pH, UA: 7 (ref 5.0–7.5)

## 2019-06-28 LAB — MICROSCOPIC EXAMINATION
Epithelial Cells (non renal): >10 /hpf — AB (ref 0–10)
RBC, Urine: NONE SEEN /hpf (ref 0–2)
Renal Epithel, UA: NONE SEEN /hpf

## 2019-06-28 MED ORDER — PREDNISONE 10 MG (48) PO TBPK: ORAL_TABLET | ORAL | 0 refills | Status: DC

## 2019-06-28 MED ORDER — TIZANIDINE HCL 6 MG PO CAPS: 6.0000 mg | ORAL_CAPSULE | Freq: Three times a day (TID) | ORAL | 1 refills | Status: DC | PRN

## 2019-06-28 NOTE — Progress Notes (Signed)
Chief Complaint  Patient presents with  . Back Pain    Radiating  . Elbow Pain    Right    HPI  Patient presents today for four days of increasing low back pain. She relates taht it is in the midline, lumber area, points to approx. L3. Radiates laterally to both sides. No abdominal pain. Has aching at the base of t he neck as well. Onset at about   the same time.  Is that she has to lift her 35 pound toddler multiple times daily. PMH: Smoking status noted ROS: Per HPI  Objective: BP 112/70   Pulse 85   Temp 98 F (36.7 C) (Temporal)   Ht _0  (1.6 m)   Wt 188 lb 9.6 oz (85.5 kg)   BMI 33.41 kg/m  Gen: NAD, alert, cooperative with exam HEENT: NCAT, EOMI, PERRL CV: RRR, good S1/S2, no murmur Resp: CTABL, no wheezes, non-labored Ext: No edema, warm. Tender at paraspinous musculature at the L3 level bilaterally.  Also at the base of the neck trapezius border.  Both of these areas represent usual tender points for fibromyalgia. Neuro: Alert and oriented, No gross deficits Results for orders placed or performed in visit on 06/28/19  Microscopic Examination   URINE  Result Value Ref Range   WBC, UA 0-5 0 - 5 /hpf   RBC None seen 0 - 2 /hpf   Epithelial Cells (non renal) >10 (A) 0 - 10 /hpf   Renal Epithel, UA None seen None seen /hpf   Bacteria, UA Few None seen/Few  Urinalysis, Complete  Result Value Ref Range   Specific Gravity, UA 1.020 1.005 - 1.030   pH, UA 7.0 5.0 - 7.5   Color, UA Yellow Yellow   Appearance Ur Clear Clear   Leukocytes,UA Negative Negative   Protein,UA Negative Negative/Trace   Glucose, UA Negative Negative   Ketones, UA Negative Negative   RBC, UA Negative Negative   Bilirubin, UA Negative Negative   Urobilinogen, Ur 0.2 0.2 - 1.0 mg/dL   Nitrite, UA Negative Negative   Microscopic Examination See below:    Final x-ray report received shows that there is no radiologic abnormality on plain films. Assessment and plan:  1. UTI symptoms   2.  Lumbar pain   3. Cervicalgia    Reports minimal symptoms of UTI other than the back pain.  In mild characterizing the back pain needs to my believe that this is more musculoskeletal back pain that it is referred UTI pain specifically her urinalysis came back free of any signs of infection.  It is almost certainly routed although lifting her son may have contributed Meds ordered this encounter  Medications  . tizanidine (ZANAFLEX) 6 MG capsule    Sig: Take 1 capsule (6 mg total) by mouth 3 (three) times daily as needed for muscle spasms.    Dispense:  90 capsule    Refill:  1  . predniSONE (STERAPRED UNI-PAK 48 TAB) 10 MG (48) TBPK tablet    Sig: Use as directed    Dispense:  48 tablet    Refill:  0    Orders Placed This Encounter  Procedures  . Urine Culture    Standing Status:   Future    Standing Expiration Date:   07/28/2019  . Microscopic Examination  . DG Cervical Spine Complete    Standing Status:   Future    Number of Occurrences:   1    Standing Expiration Date:   08/27/2020  Order Specific Question:   Reason for Exam (SYMPTOM  OR DIAGNOSIS REQUIRED)    Answer:   low back pain. Fhx bone cancer    Order Specific Question:   Is the patient pregnant?    Answer:   No    Order Specific Question:   Preferred imaging location?    Answer:   Internal  . DG Lumbar Spine 2-3 Views    Standing Status:   Future    Number of Occurrences:   1    Standing Expiration Date:   08/27/2020    Order Specific Question:   Reason for Exam (SYMPTOM  OR DIAGNOSIS REQUIRED)    Answer:   low back pain. Fhx bone cancer    Order Specific Question:   Is the patient pregnant?    Answer:   No    Order Specific Question:   Preferred imaging location?    Answer:   Internal  . Urinalysis, Complete  . Arthritis Panel  . CYCLIC CITRUL PEPTIDE ANTIBODY, IGG/IGA  . HLA-B27 antigen    Follow up as needed.  Claretta Fraise, MD

## 2019-11-14 DIAGNOSIS — G441 Vascular headache, not elsewhere classified: Secondary | ICD-10-CM | POA: Diagnosis not present

## 2019-11-14 DIAGNOSIS — S134XXA Sprain of ligaments of cervical spine, initial encounter: Secondary | ICD-10-CM | POA: Diagnosis not present

## 2019-11-14 DIAGNOSIS — S338XXA Sprain of other parts of lumbar spine and pelvis, initial encounter: Secondary | ICD-10-CM | POA: Diagnosis not present

## 2019-11-14 DIAGNOSIS — S233XXA Sprain of ligaments of thoracic spine, initial encounter: Secondary | ICD-10-CM | POA: Diagnosis not present

## 2019-11-20 DIAGNOSIS — G441 Vascular headache, not elsewhere classified: Secondary | ICD-10-CM | POA: Diagnosis not present

## 2019-11-20 DIAGNOSIS — S134XXA Sprain of ligaments of cervical spine, initial encounter: Secondary | ICD-10-CM | POA: Diagnosis not present

## 2019-11-20 DIAGNOSIS — S233XXA Sprain of ligaments of thoracic spine, initial encounter: Secondary | ICD-10-CM | POA: Diagnosis not present

## 2019-11-20 DIAGNOSIS — S338XXA Sprain of other parts of lumbar spine and pelvis, initial encounter: Secondary | ICD-10-CM | POA: Diagnosis not present

## 2019-12-10 DIAGNOSIS — S338XXA Sprain of other parts of lumbar spine and pelvis, initial encounter: Secondary | ICD-10-CM | POA: Diagnosis not present

## 2019-12-10 DIAGNOSIS — S233XXA Sprain of ligaments of thoracic spine, initial encounter: Secondary | ICD-10-CM | POA: Diagnosis not present

## 2019-12-10 DIAGNOSIS — S134XXA Sprain of ligaments of cervical spine, initial encounter: Secondary | ICD-10-CM | POA: Diagnosis not present

## 2019-12-10 DIAGNOSIS — G441 Vascular headache, not elsewhere classified: Secondary | ICD-10-CM | POA: Diagnosis not present

## 2020-01-13 DIAGNOSIS — G441 Vascular headache, not elsewhere classified: Secondary | ICD-10-CM | POA: Diagnosis not present

## 2020-01-13 DIAGNOSIS — S338XXA Sprain of other parts of lumbar spine and pelvis, initial encounter: Secondary | ICD-10-CM | POA: Diagnosis not present

## 2020-01-13 DIAGNOSIS — S134XXA Sprain of ligaments of cervical spine, initial encounter: Secondary | ICD-10-CM | POA: Diagnosis not present

## 2020-01-13 DIAGNOSIS — S233XXA Sprain of ligaments of thoracic spine, initial encounter: Secondary | ICD-10-CM | POA: Diagnosis not present

## 2020-03-10 ENCOUNTER — Encounter: Payer: Self-pay | Admitting: Family Medicine

## 2020-03-10 ENCOUNTER — Ambulatory Visit (INDEPENDENT_AMBULATORY_CARE_PROVIDER_SITE_OTHER): Payer: BC Managed Care – PPO | Admitting: Family Medicine

## 2020-03-10 DIAGNOSIS — J301 Allergic rhinitis due to pollen: Secondary | ICD-10-CM

## 2020-03-10 MED ORDER — PREDNISONE 20 MG PO TABS: ORAL_TABLET | ORAL | 0 refills | Status: DC

## 2020-03-10 MED ORDER — FLUTICASONE PROPIONATE 50 MCG/ACT NA SUSP: 1.0000 | Freq: Two times a day (BID) | NASAL | 6 refills | Status: DC | PRN

## 2020-03-10 NOTE — Progress Notes (Signed)
Virtual Visit via telephone Note  I connected with Kara Irwin on 03/10/20 at 1315 by telephone and verified that I am speaking with the correct person using two identifiers. Kara Irwin is currently located at home and patient are currently with her during visit. The provider, Fransisca Kaufmann Latia Mataya, MD is located in their office at time of visit.  Call ended at 1326  I discussed the limitations, risks, security and privacy concerns of performing an evaluation and management service by telephone and the availability of in person appointments. I also discussed with the patient that there may be a patient responsible charge related to this service. The patient expressed understanding and agreed to proceed.   History and Present Illness: Patient is calling in for 4 days of sinus drainage on the right side and congestion.  She has vit c and mucinex and sudafed and nasal saline. At night she has a lot more symptoms. She does get allergies this time of year. She denies fevers or chills or body aches.  She denies any shortness breath or wheezing. She denies any sick contacts.   1. Allergic rhinitis due to pollen, unspecified seasonality     Outpatient Encounter Medications as of 03/10/2020  Medication Sig  . cetirizine (ZYRTEC) 10 MG tablet Take 1 tablet (10 mg total) by mouth daily.  . fluticasone (FLONASE) 50 MCG/ACT nasal spray Place 1 spray into both nostrils 2 (two) times daily as needed for allergies or rhinitis.  Marland Kitchen ibuprofen (ADVIL,MOTRIN) 800 MG tablet Take 1 tablet (800 mg total) by mouth every 8 (eight) hours as needed.  . Omega-3 Fatty Acids (FISH OIL) 1000 MG CAPS Take 1 capsule by mouth.  Marland Kitchen OVER THE COUNTER MEDICATION Flo-gen  . pantoprazole (PROTONIX) 40 MG tablet Take 1 tablet (40 mg total) by mouth daily. For stomach  . predniSONE (DELTASONE) 20 MG tablet 2 po at same time daily for 5 days  . tizanidine (ZANAFLEX) 6 MG capsule Take 1 capsule (6 mg total) by mouth 3  (three) times daily as needed for muscle spasms.  . [DISCONTINUED] predniSONE (STERAPRED UNI-PAK 48 TAB) 10 MG (48) TBPK tablet Use as directed   No facility-administered encounter medications on file as of 03/10/2020.    Review of Systems  Constitutional: Negative for chills and fever.  HENT: Positive for congestion, postnasal drip, rhinorrhea and sinus pressure. Negative for ear discharge, ear pain, sneezing and sore throat.   Eyes: Negative for pain, redness and visual disturbance.  Respiratory: Positive for cough. Negative for chest tightness and shortness of breath.   Cardiovascular: Negative for chest pain and leg swelling.  Musculoskeletal: Negative for back pain and gait problem.  Skin: Negative for rash.  Neurological: Negative for light-headedness and headaches.  Psychiatric/Behavioral: Negative for agitation and behavioral problems.  All other systems reviewed and are negative.   Observations/Objective: Patient sounds comfortable and in no acute distress  Assessment and Plan: Problem List Items Addressed This Visit    None    Visit Diagnoses    Allergic rhinitis due to pollen, unspecified seasonality    -  Primary   Relevant Medications   predniSONE (DELTASONE) 20 MG tablet   fluticasone (FLONASE) 50 MCG/ACT nasal spray       Follow up plan: Return if symptoms worsen or fail to improve.     I discussed the assessment and treatment plan with the patient. The patient was provided an opportunity to ask questions and all were answered. The patient agreed with  the plan and demonstrated an understanding of the instructions.   The patient was advised to call back or seek an in-person evaluation if the symptoms worsen or if the condition fails to improve as anticipated.  The above assessment and management plan was discussed with the patient. The patient verbalized understanding of and has agreed to the management plan. Patient is aware to call the clinic if symptoms  persist or worsen. Patient is aware when to return to the clinic for a follow-up visit. Patient educated on when it is appropriate to go to the emergency department.    I provided 11 minutes of non-face-to-face time during this encounter.    Worthy Rancher, MD

## 2020-03-16 ENCOUNTER — Other Ambulatory Visit: Payer: Self-pay | Admitting: Family Medicine

## 2020-03-16 ENCOUNTER — Encounter: Payer: Self-pay | Admitting: Family Medicine

## 2020-03-16 DIAGNOSIS — J301 Allergic rhinitis due to pollen: Secondary | ICD-10-CM

## 2020-03-16 MED ORDER — PREDNISONE 20 MG PO TABS: ORAL_TABLET | ORAL | 0 refills | Status: DC

## 2020-03-23 ENCOUNTER — Encounter: Payer: Self-pay | Admitting: Family Medicine

## 2020-04-13 ENCOUNTER — Other Ambulatory Visit: Payer: Self-pay

## 2020-04-13 ENCOUNTER — Ambulatory Visit (INDEPENDENT_AMBULATORY_CARE_PROVIDER_SITE_OTHER): Payer: BC Managed Care – PPO | Admitting: Family Medicine

## 2020-04-13 ENCOUNTER — Encounter: Payer: Self-pay | Admitting: Family Medicine

## 2020-04-13 VITALS — BP 120/80 | HR 70 | Temp 98.2°F | Ht 63.0 in | Wt 191.8 lb

## 2020-04-13 DIAGNOSIS — E782 Mixed hyperlipidemia: Secondary | ICD-10-CM

## 2020-04-13 DIAGNOSIS — Z114 Encounter for screening for human immunodeficiency virus [HIV]: Secondary | ICD-10-CM | POA: Diagnosis not present

## 2020-04-13 DIAGNOSIS — Z0000 Encounter for general adult medical examination without abnormal findings: Secondary | ICD-10-CM

## 2020-04-13 DIAGNOSIS — Z0001 Encounter for general adult medical examination with abnormal findings: Secondary | ICD-10-CM | POA: Diagnosis not present

## 2020-04-13 DIAGNOSIS — M2550 Pain in unspecified joint: Secondary | ICD-10-CM

## 2020-04-13 DIAGNOSIS — F321 Major depressive disorder, single episode, moderate: Secondary | ICD-10-CM

## 2020-04-13 DIAGNOSIS — Z1159 Encounter for screening for other viral diseases: Secondary | ICD-10-CM | POA: Diagnosis not present

## 2020-04-13 DIAGNOSIS — M797 Fibromyalgia: Secondary | ICD-10-CM

## 2020-04-13 LAB — URINALYSIS
Bilirubin, UA: NEGATIVE
Glucose, UA: NEGATIVE
Ketones, UA: NEGATIVE
Leukocytes,UA: NEGATIVE
Nitrite, UA: NEGATIVE
Protein,UA: NEGATIVE
Specific Gravity, UA: 1.015 (ref 1.005–1.030)
Urobilinogen, Ur: 0.2 mg/dL (ref 0.2–1.0)
pH, UA: 7 (ref 5.0–7.5)

## 2020-04-13 MED ORDER — ESCITALOPRAM OXALATE 10 MG PO TABS: 10.0000 mg | ORAL_TABLET | Freq: Every day | ORAL | 1 refills | Status: DC

## 2020-04-13 NOTE — Patient Instructions (Signed)
Your provider wants you to schedule an appointment with a Psychologist/Psychiatrist. The following list of offices requires the patient to call and make their own appointment, as there is information they need that only you can provide. Please feel free to choose form the following providers:  [facility]   [phone] Crisis Recovery in [place] [phone]  [facility]  [phone]   [address] [place], [place]  (Scheduled through [facility]) Must call and do an interview for appointment. Sees Children / Accepts Medicaid  [facility]    [phone]  [address], [address]    [place], [place]       [facility]  [phone] [address] [place], [place]  Evaluates for Autism but does not treat it Sees Children / Accepts Medicaid  [facility]    [phone] [address], [address]   [place], [place] Medication management, substance abuse, bipolar, grief, family, marriage, OCD, anxiety, PTSD Sees children / Accepts Medicaid  [facility]    [phone] [address] [place], [place] Sees children / Accepts Medicaid  [facility]  [phone] [address] [place], [place]   Dr Akinlayo     [phone] [address] [place], [place]  Sees ADD & ADHD for treatment Accepts Medicaid  [facility]  [phone] [address] [place], [place] Evaluates for Autism Accepts Medicaid  [facility]  [phone] [address] [place], [place]  Does Adult ADD evaluations Does not accept Medicaid  [facility]   [phone] [address]   [place], [place] Uses animal therapy  Sees children as young as 3 years old Accepts Medicaid  [facility]     [phone]    [address]  [place], [place] [address] Sees children Accepts Medicaid  

## 2020-04-13 NOTE — Progress Notes (Signed)
Subjective:  Patient ID: Kara Irwin, female    DOB: July 01, 1985  Age: 33 y.o. MRN: [id number]  CC: Annual Exam   HPI Kara Irwin presents for CPE.   follow-up of elevated cholesterol. Doing well without complaints on current medication. Denies side effects of statin including myalgia and arthralgia and nausea. Also in today for liver function testing. Currently no chest pain, shortness of breath or other cardiovascular related symptoms noted.  PHQ9 SCORE ONLY 04/13/2020 06/28/2019 10/06/2016  PHQ-9 Total Score 20 0 0    GAD 7 : Generalized Anxiety Score 04/13/2020  Nervous, Anxious, on Edge 3  Control/stop worrying 3  Worry too much - different things 3  Trouble relaxing 3  Restless 3  Easily annoyed or irritable 1  Afraid - awful might happen 0  Total GAD 7 Score 16  Anxiety Difficulty Somewhat difficult       History Kara Irwin has a past medical history of Hyperlipidemia.   She has a past surgical history that includes wisdom teetjh.   Her family history includes Arthritis in her father; Diabetes in her father; Gout in her father; Hyperlipidemia in her father and mother; Hypertension in her father and mother.She reports that she has never smoked. She has never used smokeless tobacco. She reports that she does not drink alcohol and does not use drugs.  Current Outpatient Medications on File Prior to Visit  Medication Sig Dispense Refill  . cetirizine (ZYRTEC) 10 MG tablet Take 1 tablet (10 mg total) by mouth daily. 30 tablet 11  . Omega-3 Fatty Acids (FISH OIL) 1000 MG CAPS Take 1 capsule by mouth.    Marland Kitchen OVER THE COUNTER MEDICATION Flo-gen     No current facility-administered medications on file prior to visit.    ROS Review of Systems  Constitutional: Negative for chills, diaphoresis and fever.  HENT: Negative for congestion, ear pain, hearing loss, nosebleeds, sore throat and tinnitus.   Eyes: Negative for photophobia, pain, discharge and redness.    Respiratory: Negative for cough, shortness of breath and wheezing.   Cardiovascular: Negative for chest pain, palpitations and leg swelling.  Gastrointestinal: Negative for abdominal pain, blood in stool, constipation, diarrhea, nausea and vomiting.  Endocrine: Negative for polydipsia.  Genitourinary: Negative for dysuria, flank pain, frequency, hematuria and urgency.  Musculoskeletal: Negative for back pain, myalgias and neck pain.  Skin: Negative for rash.  Allergic/Immunologic: Negative for environmental allergies.  Neurological: Negative for dizziness, tremors, seizures, weakness and headaches.  Hematological: Does not bruise/bleed easily.  Psychiatric/Behavioral: Negative for hallucinations and suicidal ideas. The patient is not nervous/anxious.     Objective:  BP 120/80   Pulse 70   Temp 98.2 F (36.8 C) (Temporal)   Ht _0  (1.6 m)   Wt 191 lb 12.8 oz (87 kg)   BMI 33.98 kg/m   BP Readings from Last 3 Encounters:  04/13/20 120/80  06/28/19 112/70  10/06/16 117/77    Wt Readings from Last 3 Encounters:  04/13/20 191 lb 12.8 oz (87 kg)  06/28/19 188 lb 9.6 oz (85.5 kg)  10/06/16 152 lb 6.4 oz (69.1 kg)     Physical Exam Vitals reviewed.  Constitutional:      General: She is not in acute distress.    Appearance: She is well-developed. She is not diaphoretic.  HENT:     Head: Normocephalic and atraumatic.     Right Ear: External ear normal.     Left Ear: External ear normal.  Nose: Nose normal.     Mouth/Throat:     Pharynx: No oropharyngeal exudate.  Eyes:     General: No scleral icterus.       Right eye: No discharge.        Left eye: No discharge.     Conjunctiva/sclera: Conjunctivae normal.     Pupils: Pupils are equal, round, and reactive to light.  Neck:     Thyroid: No thyromegaly.     Vascular: No JVD.  Cardiovascular:     Rate and Rhythm: Normal rate and regular rhythm.     Heart sounds: Normal heart sounds. No murmur heard.  No friction  rub. No gallop.   Pulmonary:     Effort: Pulmonary effort is normal. No respiratory distress.     Breath sounds: Normal breath sounds. No stridor. No wheezing or rales.  Chest:     Chest wall: No tenderness.  Abdominal:     General: Bowel sounds are normal. There is no distension.     Palpations: Abdomen is soft.     Tenderness: There is no abdominal tenderness.  Musculoskeletal:        General: Normal range of motion.     Cervical back: Normal range of motion and neck supple.  Lymphadenopathy:     Cervical: No cervical adenopathy.  Skin:    General: Skin is warm and dry.  Neurological:     Mental Status: She is alert and oriented to person, place, and time.     Cranial Nerves: No cranial nerve deficit.     Motor: No abnormal muscle tone.     Coordination: Coordination normal.     Deep Tendon Reflexes: Reflexes normal.  Psychiatric:        Behavior: Behavior normal.        Thought Content: Thought content normal.        Judgment: Judgment normal.     No results found for: HGBA1C  Lab Results  Component Value Date   WBC 5.3 01/11/2016   HGB 14.0 01/11/2016   HCT 41.8 01/11/2016   PLT 301 01/11/2016   GLUCOSE 96 01/11/2016   CHOL 210 (H) 12/16/2013   TRIG 261 (H) 12/16/2013   HDL 32 (L) 12/16/2013   LDLCALC 126 (H) 12/16/2013   ALT 30 01/11/2016   AST 19 01/11/2016   NA 138 01/11/2016   K 5.2 01/11/2016   CL 99 01/11/2016   CREATININE 0.65 01/11/2016   BUN 13 01/11/2016   CO2 23 01/11/2016   TSH 2.870 12/16/2013    US Abdomen Complete  Result Date: 10/06/2014 CLINICAL DATA:  Left upper quadrant pain with nausea and vomiting EXAM: ULTRASOUND ABDOMEN COMPLETE COMPARISON:  CT abdomen pelvis 03/16/2004 FINDINGS: Gallbladder: No gallstones or wall thickening visualized. No sonographic Murphy sign noted. Common bile duct: Diameter: 3.0 mm Liver: No focal lesion identified. Within normal limits in parenchymal echogenicity. IVC: No abnormality visualized. Pancreas:  Visualized portion unremarkable. Spleen: Size and appearance within normal limits. Right Kidney: Length: 11.5 cm. Parapelvic cyst versus small extra renal pelvis. Echogenicity within normal limits. No mass or hydronephrosis visualized. Left Kidney: Length: 13.4 mm. Echogenicity within normal limits. No mass or hydronephrosis visualized. Abdominal aorta: No aneurysm visualized. Other findings: None. IMPRESSION: Negative for gallstones.  No acute abnormality. Electronically Signed   By: Franchot Gallo M.D.   On: 10/06/2014 09:47    Assessment & Plan:   Kara Irwin was seen today for annual exam.  Diagnoses and all orders for this visit:  Mixed hyperlipidemia -     CMP14+EGFR -     CBC with Differential/Platelet -     Lipid panel  Fibromyalgia -     CMP14+EGFR -     CBC with Differential/Platelet -     TSH  Well adult exam -     Hepatitis C antibody -     HIV Antibody (routine testing w rflx) -     CMP14+EGFR -     CBC with Differential/Platelet -     Lipid panel -     Urinalysis  Encounter for screening for HIV -     HIV Antibody (routine testing w rflx)  Need for hepatitis C screening test -     Hepatitis C antibody  Arthralgia, unspecified joint -     Sedimentation rate  Current moderate episode of major depressive disorder without prior episode (HCC) -     TSH  Other orders -     escitalopram (LEXAPRO) 10 MG tablet; Take 1 tablet (10 mg total) by mouth daily.   I have discontinued Kara Irwin. Kara Irwin's ibuprofen, pantoprazole, tizanidine, predniSONE, fluticasone, and predniSONE. I am also having her start on escitalopram. Additionally, I am having her maintain her Fish Oil, OVER THE COUNTER MEDICATION, and cetirizine.  Meds ordered this encounter  Medications  . escitalopram (LEXAPRO) 10 MG tablet    Sig: Take 1 tablet (10 mg total) by mouth daily.    Dispense:  90 tablet    Refill:  1     Follow-up: Return in about 1 month (around 05/13/2020) for Anxiety,  Depression.  Claretta Fraise, M.D.

## 2020-04-14 ENCOUNTER — Encounter: Payer: Self-pay | Admitting: Family Medicine

## 2020-04-14 ENCOUNTER — Telehealth: Payer: Self-pay

## 2020-04-14 LAB — LIPID PANEL
Chol/HDL Ratio: 6.4 ratio — ABNORMAL HIGH (ref 0.0–4.4)
Cholesterol, Total: 335 mg/dL — ABNORMAL HIGH (ref 100–199)
HDL: 52 mg/dL (ref >39)
LDL Chol Calc (NIH): 245 mg/dL — ABNORMAL HIGH (ref 0–99)
Triglycerides: 190 mg/dL — ABNORMAL HIGH (ref 0–149)
VLDL Cholesterol Cal: 38 mg/dL (ref 5–40)

## 2020-04-14 LAB — CBC WITH DIFFERENTIAL/PLATELET
Basophils Absolute: 0 10*3/uL (ref 0.0–0.2)
Basos: 0 %
EOS (ABSOLUTE): 0.1 10*3/uL (ref 0.0–0.4)
Eos: 1 %
Hematocrit: 43.5 % (ref 34.0–46.6)
Hemoglobin: 15 g/dL (ref 11.1–15.9)
Immature Grans (Abs): 0 10*3/uL (ref 0.0–0.1)
Immature Granulocytes: 1 %
Lymphocytes Absolute: 2.1 10*3/uL (ref 0.7–3.1)
Lymphs: 28 %
MCH: 30.9 pg (ref 26.6–33.0)
MCHC: 34.5 g/dL (ref 31.5–35.7)
MCV: 90 fL (ref 79–97)
Monocytes Absolute: 0.6 10*3/uL (ref 0.1–0.9)
Monocytes: 7 %
Neutrophils Absolute: 4.7 10*3/uL (ref 1.4–7.0)
Neutrophils: 63 %
Platelets: 364 10*3/uL (ref 150–450)
RBC: 4.86 x10E6/uL (ref 3.77–5.28)
RDW: 12.3 % (ref 11.7–15.4)
WBC: 7.5 10*3/uL (ref 3.4–10.8)

## 2020-04-14 LAB — TSH: TSH: 1.53 u[IU]/mL (ref 0.450–4.500)

## 2020-04-14 LAB — CMP14+EGFR
ALT: 49 IU/L — ABNORMAL HIGH (ref 0–32)
AST: 28 IU/L (ref 0–40)
Albumin/Globulin Ratio: 1.6 (ref 1.2–2.2)
Albumin: 4.9 g/dL — ABNORMAL HIGH (ref 3.8–4.8)
Alkaline Phosphatase: 86 IU/L (ref 44–121)
BUN/Creatinine Ratio: 15 (ref 9–23)
BUN: 10 mg/dL (ref 6–20)
Bilirubin Total: 0.3 mg/dL (ref 0.0–1.2)
CO2: 23 mmol/L (ref 20–29)
Calcium: 9.8 mg/dL (ref 8.7–10.2)
Chloride: 97 mmol/L (ref 96–106)
Creatinine, Ser: 0.68 mg/dL (ref 0.57–1.00)
GFR calc Af Amer: 131 mL/min/{1.73_m2} (ref >59)
GFR calc non Af Amer: 114 mL/min/{1.73_m2} (ref >59)
Globulin, Total: 3 g/dL (ref 1.5–4.5)
Glucose: 93 mg/dL (ref 65–99)
Potassium: 4.4 mmol/L (ref 3.5–5.2)
Sodium: 138 mmol/L (ref 134–144)
Total Protein: 7.9 g/dL (ref 6.0–8.5)

## 2020-04-14 LAB — HEPATITIS C ANTIBODY: Hep C Virus Ab: <0.1 s/co ratio (ref 0.0–0.9)

## 2020-04-14 LAB — HIV ANTIBODY (ROUTINE TESTING W REFLEX): HIV Screen 4th Generation wRfx: NONREACTIVE

## 2020-04-14 LAB — SEDIMENTATION RATE: Sed Rate: 33 mm/hr — ABNORMAL HIGH (ref 0–32)

## 2020-04-14 MED ORDER — ROSUVASTATIN CALCIUM 10 MG PO TABS: 10.0000 mg | ORAL_TABLET | Freq: Every day | ORAL | 1 refills | Status: DC

## 2020-04-14 NOTE — Telephone Encounter (Signed)
Ms. Bloodworth sent a question about this through my chart.  I answered it on that format.

## 2020-04-15 ENCOUNTER — Encounter: Payer: Self-pay | Admitting: Family Medicine

## 2020-04-15 NOTE — Telephone Encounter (Signed)
I ordered it from the blood that was already drawn.   Kara Irwin

## 2020-04-16 ENCOUNTER — Encounter: Payer: Self-pay | Admitting: Family Medicine

## 2020-04-16 LAB — ANA COMPREHENSIVE PANEL
Anti JO-1: <0.2 AI (ref 0.0–0.9)
Centromere Ab Screen: <0.2 AI (ref 0.0–0.9)
Chromatin Ab SerPl-aCnc: <0.2 AI (ref 0.0–0.9)
ENA RNP Ab: <0.2 AI (ref 0.0–0.9)
ENA SM Ab Ser-aCnc: <0.2 AI (ref 0.0–0.9)
ENA SSA (RO) Ab: <0.2 AI (ref 0.0–0.9)
ENA SSB (LA) Ab: <0.2 AI (ref 0.0–0.9)
Scleroderma (Scl-70) (ENA) Antibody, IgG: <0.2 AI (ref 0.0–0.9)
dsDNA Ab: <1 IU/mL (ref 0–9)

## 2020-04-16 LAB — RHEUMATOID FACTOR: Rheumatoid fact SerPl-aCnc: <10 IU/mL (ref 0.0–13.9)

## 2020-04-16 LAB — SPECIMEN STATUS REPORT

## 2020-05-13 ENCOUNTER — Ambulatory Visit: Payer: BC Managed Care – PPO | Admitting: Family Medicine

## 2020-06-12 ENCOUNTER — Encounter: Payer: Self-pay | Admitting: Family Medicine

## 2020-06-12 DIAGNOSIS — Z20822 Contact with and (suspected) exposure to covid-19: Secondary | ICD-10-CM | POA: Diagnosis not present

## 2020-06-13 ENCOUNTER — Encounter: Payer: Self-pay | Admitting: Family Medicine

## 2020-06-15 ENCOUNTER — Other Ambulatory Visit: Payer: Self-pay | Admitting: Family Medicine

## 2020-06-15 DIAGNOSIS — K279 Peptic ulcer, site unspecified, unspecified as acute or chronic, without hemorrhage or perforation: Secondary | ICD-10-CM

## 2020-06-15 DIAGNOSIS — K589 Irritable bowel syndrome without diarrhea: Secondary | ICD-10-CM

## 2020-06-22 ENCOUNTER — Encounter: Payer: Self-pay | Admitting: Internal Medicine

## 2020-07-06 ENCOUNTER — Encounter: Payer: Self-pay | Admitting: Family Medicine

## 2020-07-07 ENCOUNTER — Other Ambulatory Visit: Payer: Self-pay | Admitting: Family Medicine

## 2020-07-07 DIAGNOSIS — E559 Vitamin D deficiency, unspecified: Secondary | ICD-10-CM

## 2020-07-07 NOTE — Telephone Encounter (Signed)
Vit D was not done on labs. Does she need an appointment or can we just order the vit D? Please advise

## 2020-07-07 NOTE — Telephone Encounter (Signed)
I ordered a vitamin D level.  She can just drop by for that.  It does not require her to fast.

## 2020-07-09 ENCOUNTER — Encounter: Payer: Self-pay | Admitting: Family Medicine

## 2020-07-10 IMAGING — DX DG LUMBAR SPINE 2-3V
2 series · 2 of 2 positions shown · non-contrast
Comparison: None.

CLINICAL DATA: Low back pain, no injury

EXAM:
LUMBAR SPINE - 2-3 VIEW

[l-spine ap]
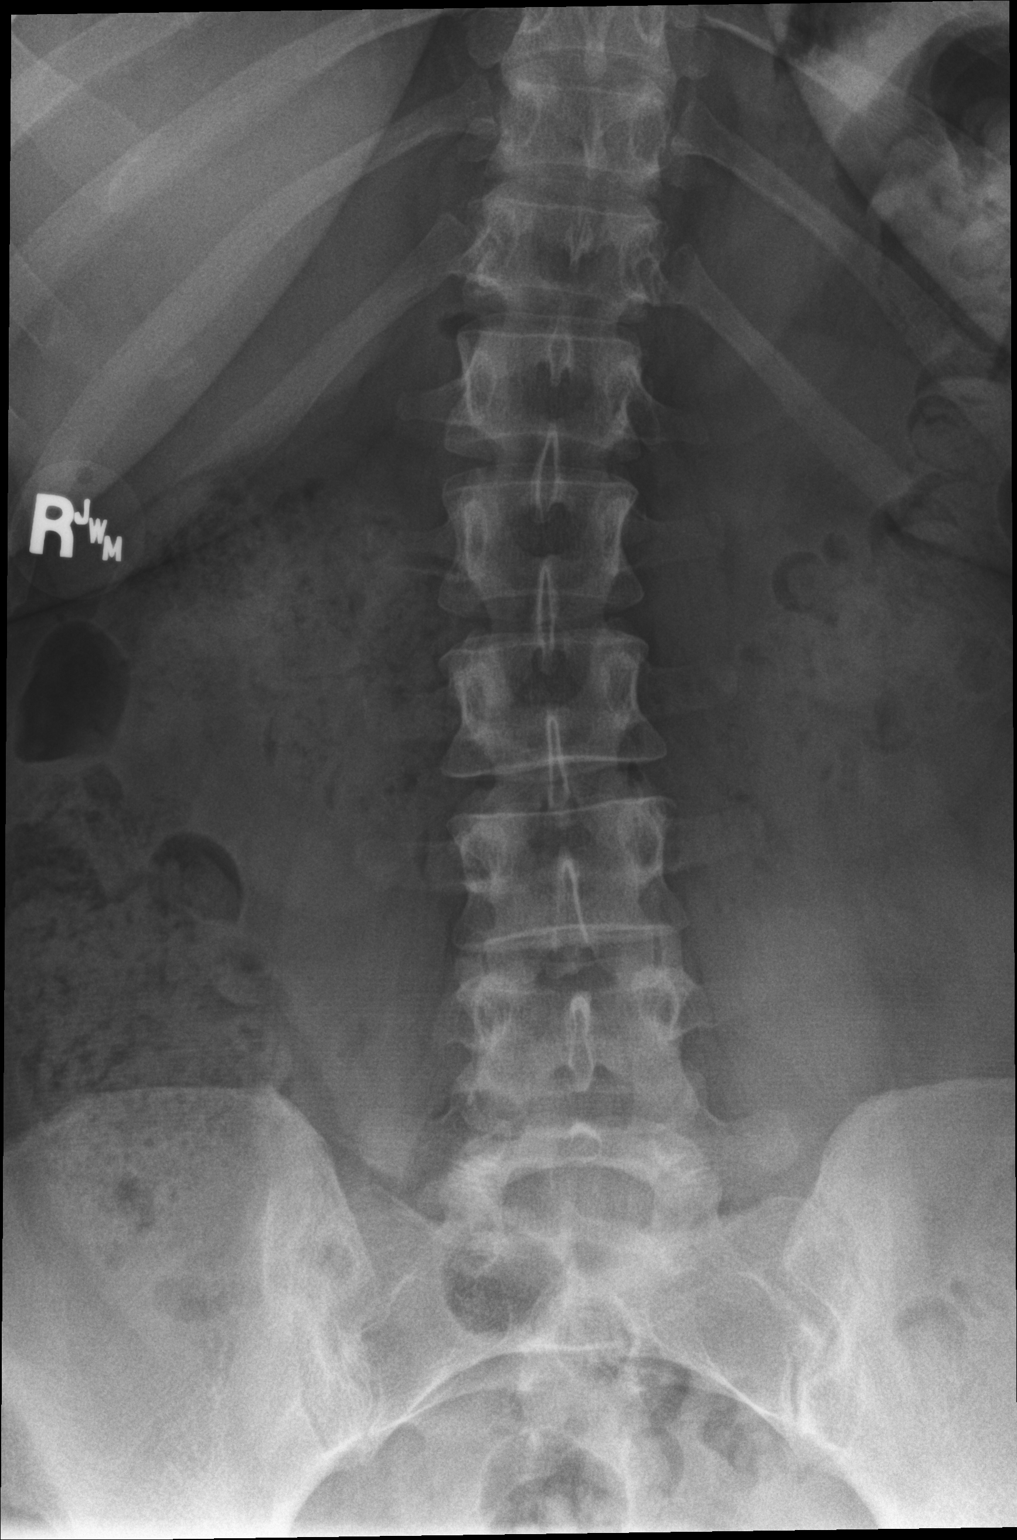

[l-spine lat]
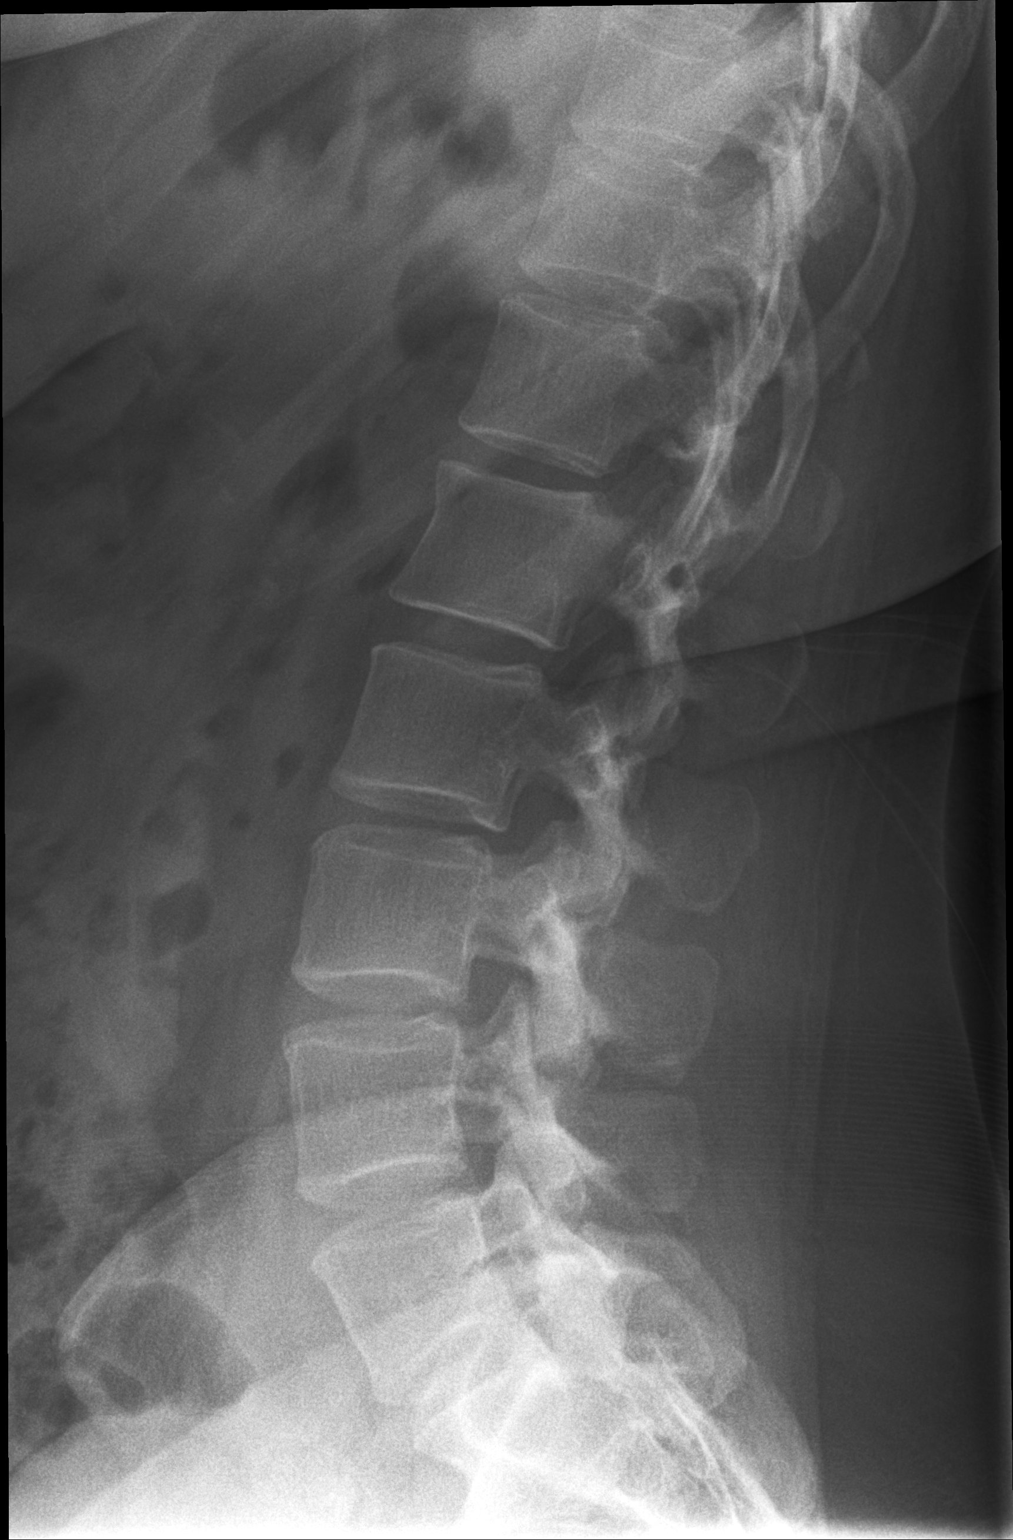

[2 of 2 positions shown; findings below may reference images not displayed]

FINDINGS: No fracture or dislocation of the lumbar spine. Disc spaces and
vertebral body heights are preserved. Mild facet degenerative change
of the lower lumbar spine. Nonobstructive pattern of overlying bowel
gas.
IMPRESSION: No fracture or dislocation of the lumbar spine. Disc spaces and
vertebral body heights are preserved. Mild facet degenerative change
of the lower lumbar spine. Lumbar disc and neural foraminal
pathology may be further evaluated by MRI if indicated by localizing
signs and symptoms.

## 2020-07-20 ENCOUNTER — Other Ambulatory Visit: Payer: Self-pay

## 2020-07-20 ENCOUNTER — Other Ambulatory Visit: Payer: BC Managed Care – PPO

## 2020-07-20 DIAGNOSIS — E559 Vitamin D deficiency, unspecified: Secondary | ICD-10-CM | POA: Diagnosis not present

## 2020-07-21 ENCOUNTER — Other Ambulatory Visit: Payer: Self-pay | Admitting: Family Medicine

## 2020-07-21 ENCOUNTER — Encounter: Payer: Self-pay | Admitting: Family Medicine

## 2020-07-21 LAB — VITAMIN D 25 HYDROXY (VIT D DEFICIENCY, FRACTURES): Vit D, 25-Hydroxy: 18 ng/mL — ABNORMAL LOW (ref 30.0–100.0)

## 2020-07-21 MED ORDER — VITAMIN D (ERGOCALCIFEROL) 1.25 MG (50000 UNIT) PO CAPS: 50000.0000 [IU] | ORAL_CAPSULE | ORAL | 3 refills | Status: AC

## 2020-07-28 DIAGNOSIS — N898 Other specified noninflammatory disorders of vagina: Secondary | ICD-10-CM | POA: Diagnosis not present

## 2020-07-28 DIAGNOSIS — E232 Diabetes insipidus: Secondary | ICD-10-CM | POA: Diagnosis not present

## 2020-07-28 DIAGNOSIS — Z6836 Body mass index (BMI) 36.0-36.9, adult: Secondary | ICD-10-CM | POA: Diagnosis not present

## 2020-07-30 ENCOUNTER — Other Ambulatory Visit: Payer: Self-pay

## 2020-07-30 ENCOUNTER — Ambulatory Visit: Payer: BC Managed Care – PPO | Admitting: Nurse Practitioner

## 2020-07-30 ENCOUNTER — Encounter: Payer: Self-pay | Admitting: Nurse Practitioner

## 2020-07-30 DIAGNOSIS — R109 Unspecified abdominal pain: Secondary | ICD-10-CM | POA: Diagnosis not present

## 2020-07-30 DIAGNOSIS — Z800 Family history of malignant neoplasm of digestive organs: Secondary | ICD-10-CM | POA: Diagnosis not present

## 2020-07-30 NOTE — Progress Notes (Signed)
Cc'ed to pcp °

## 2020-07-30 NOTE — Progress Notes (Signed)
Primary Care Physician:  Claretta Fraise, MD Primary Gastroenterologist:  Dr. Abbey Chatters   Chief Complaint  Patient presents with  . Peptic Ulcer Disease  . abdominal spasms    Jerking movement in abd    HPI:   Kara Irwin is a 35 y.o. female who presents on referral from primary care for peptic ulcer disease and "colon spasm". Reviewed information associated with referral including multiple phone messages including dated 115 2022 when she discussed a flare of stomach pain requesting ultrasound to see if something is inflamed. Notes stomach issues for over a year, most recent episode lasted 15 hours. Multiple recent patient messages to primary care provider. I did send a referral to GI for PUD and colon spasm.  Today she states she's doing ok overall. She has been having a spam-like symptom in her abdomen. States it feels like it did when she was pregnant and her child would be kicking in the womb. She was prescribed hyoscyamine as needed which has helped her symptoms when needed/taken, but it takes about an hour to work. She had intermittent GERD with spicy/acidic foods. She has had an episode of significant abdominal pain 06/13/20 - 06/15/20 which was generalized abdomen and back, felt like she had a lot of gas trapped. She tried Pepcid which didn't help. She also took Gas-X which didn't help. It eventually subsided. No other significant episodes of abdominal pain. Denies N/V, hematochezia, melena, fever, chills, unintentional weight loss. Denies URI or flu-like symptoms. Denies loss of sense of taste or smell. She did have COVID-19 in 2021.The patient has not received COVID-19 vaccination(s). Denies chest pain, dyspnea, dizziness, lightheadedness, syncope, near syncope. Denies any other upper or lower GI symptoms.  Past Medical History:  Diagnosis Date  . Hyperlipidemia     Past Surgical History:  Procedure Laterality Date  . wisdom teetjh      Current Outpatient Medications   Medication Sig Dispense Refill  . cetirizine (ZYRTEC) 10 MG tablet Take 1 tablet (10 mg total) by mouth daily. 30 tablet 11  . hyoscyamine (LEVSIN) 0.125 MG tablet Take 0.125 mg by mouth every 4 (four) hours as needed.    Marland Kitchen OVER THE COUNTER MEDICATION Floragen    . OVER THE COUNTER MEDICATION Selgevity for stress    . rosuvastatin (CRESTOR) 10 MG tablet Take 1 tablet (10 mg total) by mouth daily. For cholesterol 90 tablet 1  . Vitamin D, Ergocalciferol, (DRISDOL) 1.25 MG (50000 UNIT) CAPS capsule Take 1 capsule (50,000 Units total) by mouth every 7 (seven) days. 13 capsule 3   No current facility-administered medications for this visit.    Allergies as of 07/30/2020 - Review Complete 07/30/2020  Allergen Reaction Noted  . Duloxetine hcl Nausea And Vomiting 09/12/2014  . Amoxicillin-pot clavulanate  09/12/2014  . Ciprofloxacin Nausea Only 09/11/2014  . Estrogens  01/17/2013    Family History  Problem Relation Age of Onset  . Hypertension Mother   . Hyperlipidemia Mother   . Colon cancer Mother 10       Stage 3 s/p surgery and 22+ lymph nodes; no chemo  . Hypertension Father   . Diabetes Father   . Hyperlipidemia Father   . Arthritis Father   . Gout Father     Social History   Socioeconomic History  . Marital status: Single    Spouse name: Not on file  . Number of children: Not on file  . Years of education: Not on file  . Highest education level:  Not on file  Occupational History  . Not on file  Tobacco Use  . Smoking status: Never Smoker  . Smokeless tobacco: Never Used  Substance and Sexual Activity  . Alcohol use: No  . Drug use: No  . Sexual activity: Not on file  Other Topics Concern  . Not on file  Social History Narrative  . Not on file   Social Determinants of Health   Financial Resource Strain: Not on file  Food Insecurity: Not on file  Transportation Needs: Not on file  Physical Activity: Not on file  Stress: Not on file  Social Connections: Not  on file  Intimate Partner Violence: Not on file    Subjective: Review of Systems  Constitutional: Negative for chills, fever, malaise/fatigue and weight loss.  HENT: Negative for congestion and sore throat.   Respiratory: Negative for cough and shortness of breath.   Cardiovascular: Negative for chest pain and palpitations.  Gastrointestinal: Positive for abdominal pain (2 months ago). Negative for blood in stool, diarrhea, heartburn, melena, nausea and vomiting.       "twitching" of "movement" sensation  Musculoskeletal: Negative for joint pain and myalgias.  Skin: Negative for rash.  Neurological: Negative for dizziness and weakness.  Endo/Heme/Allergies: Does not bruise/bleed easily.  Psychiatric/Behavioral: Negative for depression. The patient is not nervous/anxious.   All other systems reviewed and are negative.      Objective: BP 116/80   Pulse 73   Temp (!) 97.1 F (36.2 C)   Ht 5\' 3"  (1.6 m)   Wt 198 lb 9.6 oz (90.1 kg)   LMP 07/10/2020   BMI 35.18 kg/m  Physical Exam Vitals and nursing note reviewed.  Constitutional:      General: She is not in acute distress.    Appearance: Normal appearance. She is well-developed. She is obese. She is not ill-appearing, toxic-appearing or diaphoretic.  HENT:     Head: Normocephalic and atraumatic.     Nose: No congestion or rhinorrhea.  Eyes:     General: No scleral icterus. Cardiovascular:     Rate and Rhythm: Normal rate and regular rhythm.     Heart sounds: Normal heart sounds.  Pulmonary:     Effort: Pulmonary effort is normal. No respiratory distress.     Breath sounds: Normal breath sounds.  Abdominal:     General: Bowel sounds are normal.     Palpations: Abdomen is soft. There is no hepatomegaly, splenomegaly or mass.     Tenderness: There is no abdominal tenderness. There is no guarding or rebound.     Hernia: No hernia is present.  Skin:    General: Skin is warm and dry.     Coloration: Skin is not jaundiced.      Findings: No rash.  Neurological:     General: No focal deficit present.     Mental Status: She is alert and oriented to person, place, and time.  Psychiatric:        Attention and Perception: Attention normal.        Mood and Affect: Mood normal.        Speech: Speech normal.        Behavior: Behavior normal.        Thought Content: Thought content normal.        Cognition and Memory: Cognition and memory normal.      Assessment:  Pleasant 35 year old female presents on referral from primary care for PUD and colon spasms.  No red flag/warning signs  or symptoms.  She does seem to have significant fear of colon cancer given that her mom was recently diagnosed in the past couple years with stage III colon cancer at age 54.  Peptic ulcer disease: She does not have any symptoms concerning for possible PUD.  Denies overt ongoing GERD symptoms, unless she eats foods that would likely cause her symptoms and they typically resolve on her own.  No nausea or vomiting, significant abdominal pain other than one episode a couple months ago.  Abdominal "twitching" and vague associated symptoms: She does note a spasming type sensation that she describes as "like when I was pregnant and the baby would kick."  She has been given hyoscyamine which she states helps, but typically takes about an hour to kick in.  Possible vague colon spasms.  Also possible abdominal wall muscular spasms.  Given the vague nature of her symptoms I will start a probiotic.  She does note that when she was previous taking a probiotic she did not have symptoms like this.  I recommended to continue hyoscyamine with the understanding that it could take an hour to for medication to kick in.    If these are not effective in holding off her symptoms, primary care could consider low-dose muscle relaxer for possible muscle spasms.  Family history of colon cancer: Her mother was diagnosed at age 31 of stage III colon cancer status post  surgical resection, no chemotherapy.  Her family history is updated.  She seems to be quite nervous and preoccupied with colon cancer which is why she states she really plans to be seen by GI.  Technically her mother's age at diagnosis would have her first ever colonoscopy at age 25.  However, most recent recommendations post for age 31.  We discussed that we would start around age 75-45, although we reserve the right to recommend earlier colonoscopy based on any symptoms presentation.  She verbalized understanding.   Plan: 1. Probiotic for least 1 to 2 months 2. Continue hyoscyamine 3. Consider muscle relaxer if needed for possible muscle spasms 4. Follow-up in 2 months   Thank you for allowing [place] to participate in the care of Dulcy Fanny, DNP, AGNP-C Adult & Gerontological Nurse Practitioner [facility]   07/30/2020 9:27 AM   Disclaimer: This note was dictated with voice recognition software. Similar sounding words can inadvertently be transcribed and may not be corrected upon review.

## 2020-07-30 NOTE — Patient Instructions (Signed)
Your health issues we discussed today were:   Abdominal spasms: 1. As we discussed, this could be colon spasms or could be abdominal wall muscle spasms 2. Continue taking hyoscyamine as needed as you have been 3. Keep in mind that it may take up to an hour to for the medication to kick in 4. I will also have you start a probiotic.  You can try the medication you previously had luck with. 5. We've had good success with Align, Restora, Culturelle, [employer], and [employer]; however there are many good options and you can discuss with the pharmacist if you would like further guidance. 6. Additionally, as we discussed, [employer] has apparently released a store brand version of Align which would likely be cheaper than the name brand version. 7. Colazal any worsening or severe symptoms, especially if you develop a bout of severe abdominal pain 8. You could also proceed to the emergency room if you have severe abdominal pain  Overall I recommend:  1. Continue other current medications 2. Return for follow-up in 2 months 3. Call us for any questions or concerns   ---------------------------------------------------------------  At [facility] we value your feedback. You may receive a survey about your visit today. Please share your experience as we strive to create trusting relationships with our patients to provide genuine, compassionate, quality care.  We appreciate your understanding and patience as we review any laboratory studies, imaging, and other diagnostic tests that are ordered as we care for you. Our office policy is 5 business days for review of these results, and any emergent or urgent results are addressed in a timely manner for your best interest. If you do not hear from our office in 1 week, please contact us.   We also encourage the use of MyChart, which contains your medical information for your review as well. If you are not enrolled in this feature, an  access code is on this after visit summary for your convenience. Thank you for allowing [place] to be involved in your care.  It was great to see you today!  I hope you have a great spring!!

## 2020-08-05 ENCOUNTER — Telehealth: Payer: Self-pay | Admitting: Internal Medicine

## 2020-08-05 NOTE — Telephone Encounter (Signed)
Pt called to say that she was seen recently by a NP and felt that she was "robbed" of a $70 co pay and she didn't understand why she wasn't able to see the doctor. She said that the NP gave her a prescription that isn't helping and she would feel better discussing issues with a doctor.  I explained to her that our doctors are not here every day and they are at the hospital doing procedures. Our patients mostly see our NPs and PAs, but if see wants to see a doctor I could get her in tomorrow to see Dr Abbey Chatters at 10 am. She agreed and is aware that I cancelled her OV with Walden Field, NP in May since she would be coming in tomorrow.

## 2020-08-05 NOTE — Telephone Encounter (Signed)
So, unfortunately she will have another co-pay and this is the contract between her and her insurance.  The amount of her co-pay usually is decided on her deductible amount--high copay means high deductible most of the time.  However, her co-pay may go to her deductible.   She should discuss with her carrier or customer service with who she has her insurance.  . A copay is a flat fee that you pay on the spot each time you go to your doctor or fill a prescription.  She should have been told that however they may not have.    It sounds like you took care of her need and request to see the MD.  Hopefully, he will reinstate that the [employer] are here to take care of them when the MD is not available which is most days in specialty offices.

## 2020-08-05 NOTE — Telephone Encounter (Signed)
Noted. Kara Irwin, she was just seen on 07/30/20 by Norwood Levo we charge her another copay or how do you want [place] to handle this when she comes in?

## 2020-08-05 NOTE — Telephone Encounter (Signed)
Pt called to say that she was seen recently by a NP and felt that she was "robbed" of a $70 co pay and she didn't understand why she wasn't able to see the doctor. She said that the NP gave her a prescription that isn't helping and she would feel better discussing issues with a doctor.  I explained to her that our doctors are not here every day and they are at the hospital doing procedures. Our patients mostly see our NPs and PAs, but if see wants to see a doctor I could get her in tomorrow to see Dr Abbey Chatters at 10 am. She agreed and is aware that I cancelled her OV with Walden Field, NP in May since she would be coming in tomorrow.       Documentation

## 2020-08-05 NOTE — Telephone Encounter (Signed)
noted 

## 2020-08-05 NOTE — Telephone Encounter (Signed)
Noted, no further recommendations at this time. 

## 2020-08-06 ENCOUNTER — Ambulatory Visit: Payer: BLUE CROSS/BLUE SHIELD | Admitting: Internal Medicine

## 2020-08-06 ENCOUNTER — Encounter: Payer: Self-pay | Admitting: Family Medicine

## 2020-08-06 NOTE — Telephone Encounter (Signed)
That is just fine.  Co-pay are her responsibility between her and her insurance.  She will have to pay a co-pay no matter where she goes unless this is a global surgery or certain procedures but that again is between her and her insurance and her policy.  I will make sure Gavin Potters is copies on this too.

## 2020-08-06 NOTE — Telephone Encounter (Signed)
Patient called in. She states she did not feel like she needed to pay another copay because she did not see a doctor, she saw a NP. I did advised our NP's/PA's are here to take care of our patients since our MD's are here only certain days d/t performing procedures in the hospitals, ETC. She cancelled today's appointment and stated she will get a referral to another doctor's office. FYI to Randall Hiss, Dr. Abbey Chatters, Leonia Reeves.

## 2020-08-06 NOTE — Telephone Encounter (Signed)
Noted, thanks for the heads up!

## 2020-08-07 NOTE — Telephone Encounter (Signed)
Noted  

## 2020-08-08 ENCOUNTER — Other Ambulatory Visit: Payer: Self-pay | Admitting: Family Medicine

## 2020-08-08 DIAGNOSIS — K589 Irritable bowel syndrome without diarrhea: Secondary | ICD-10-CM

## 2020-09-29 ENCOUNTER — Ambulatory Visit: Payer: BLUE CROSS/BLUE SHIELD | Admitting: Nurse Practitioner

## 2020-10-22 ENCOUNTER — Encounter: Payer: Self-pay | Admitting: Family Medicine

## 2020-10-22 ENCOUNTER — Ambulatory Visit (INDEPENDENT_AMBULATORY_CARE_PROVIDER_SITE_OTHER): Payer: BC Managed Care – PPO | Admitting: Family Medicine

## 2020-10-22 DIAGNOSIS — M722 Plantar fascial fibromatosis: Secondary | ICD-10-CM

## 2020-10-22 MED ORDER — PREDNISONE 10 MG PO TABS: ORAL_TABLET | ORAL | 0 refills | Status: DC

## 2020-10-22 NOTE — Progress Notes (Signed)
    Subjective:    Patient ID: Kara Irwin, female    DOB: Sep 28, 1985, 35 y.o.   MRN: [id number]   HPI: Kara Irwin is a 35 y.o. female presenting for onset yesterday of heel pain. Pressure at plantar midfoot helps. Tried a brace.and taht helps. Pain shoots through when she walks. No pain at the heel. NKI .     Relevant past medical, surgical, family and social history reviewed and updated as indicated.  Interim medical history since our last visit reviewed. Allergies and medications reviewed and updated.  ROS:  Review of Systems  Constitutional: Positive for activity change. Negative for fever.     Social History   Tobacco Use  Smoking Status Never Smoker  Smokeless Tobacco Never Used       Objective:     Wt Readings from Last 3 Encounters:  07/30/20 198 lb 9.6 oz (90.1 kg)  04/13/20 191 lb 12.8 oz (87 kg)  06/28/19 188 lb 9.6 oz (85.5 kg)     Exam deferred. Pt. Harboring due to COVID 19. Phone visit performed.   Assessment & Plan:   1. Plantar fasciitis     Meds ordered this encounter  Medications  . predniSONE (DELTASONE) 10 MG tablet    Sig: Take 5 daily for 3 days followed by 4,3,2 and 1 for 3 days each.    Dispense:  45 tablet    Refill:  0    Stretching exercises reviewed.     Diagnoses and all orders for this visit:  Plantar fasciitis  Other orders -     predniSONE (DELTASONE) 10 MG tablet; Take 5 daily for 3 days followed by 4,3,2 and 1 for 3 days each.    Virtual Visit via telephone Note  I discussed the limitations, risks, security and privacy concerns of performing an evaluation and management service by telephone and the availability of in person appointments. The patient was identified with two identifiers. Pt.expressed understanding and agreed to proceed. Pt. Is at home. Dr. Livia Snellen is in his office.  Follow Up Instructions:   I discussed the assessment and treatment plan with the patient. The patient was provided an  opportunity to ask questions and all were answered. The patient agreed with the plan and demonstrated an understanding of the instructions.   The patient was advised to call back or seek an in-person evaluation if the symptoms worsen or if the condition fails to improve as anticipated.   Total minutes including chart review and phone contact time: 7   Follow up plan: Return in about 6 weeks (around 12/03/2020).  Claretta Fraise, MD [place]

## 2020-11-02 ENCOUNTER — Encounter: Payer: Self-pay | Admitting: Nurse Practitioner

## 2020-11-02 ENCOUNTER — Ambulatory Visit: Payer: Managed Care, Other (non HMO) | Admitting: Nurse Practitioner

## 2020-11-02 DIAGNOSIS — B370 Candidal stomatitis: Secondary | ICD-10-CM | POA: Insufficient documentation

## 2020-11-02 DIAGNOSIS — J029 Acute pharyngitis, unspecified: Secondary | ICD-10-CM | POA: Insufficient documentation

## 2020-11-02 LAB — CULTURE, GROUP A STREP

## 2020-11-02 LAB — RAPID STREP SCREEN (MED CTR MEBANE ONLY): Strep Gp A Ag, IA W/Reflex: NEGATIVE

## 2020-11-02 MED ORDER — AZITHROMYCIN 250 MG PO TABS: ORAL_TABLET | ORAL | 0 refills | Status: AC

## 2020-11-02 MED ORDER — NYSTATIN 100000 UNIT/ML MT SUSP: 5.0000 mL | Freq: Four times a day (QID) | OROMUCOSAL | 0 refills | Status: DC

## 2020-11-02 NOTE — Assessment & Plan Note (Signed)
Patient is reporting worsening oral thrush in the last few days.  With ear hurting, sore throat, headache and fever of 100.1.  Started patient on nystatin mouth rinse, Tylenol/ibuprofen for pain and fever.Completed rapid strep results pending.

## 2020-11-02 NOTE — Patient Instructions (Signed)
Oral Thrush, Adult Oral thrush is an infection in your mouth and throat and on your tongue. It causes white patches to form in your mouth and on your tongue. Many cases of thrush are mild. But, sometimes, thrush can be serious. People who have a weak body defense system (immune system) or other diseases can be affected more. What are the causes? This condition is caused by a type of fungus called yeast. The fungus is normally present in small amounts in the mouth and nose. If a person has a long-term illness or a weak body defense system, the fungus can grow and spread quickly. This causes thrush. What increases the risk? You are more likely to develop this condition if:  You have a weak body defense system.  You are an older adult.  You have diabetes, cancer, or HIV.  You have a dry mouth.  You are pregnant or breastfeeding.  You do not take good care of your teeth. This risk is greater for people who have false teeth (dentures).  You use antibiotic or steroid medicines. What are the signs or symptoms? Symptoms of this condition include:  A burning feeling in the mouth and throat.  White patches that stick to the mouth and tongue.  A bad taste in the mouth or trouble tasting foods.  A feeling like you have cotton in your mouth.  Pain when you eat and swallow.  Not wanting to eat as much as usual.  Cracking at the corners of the mouth.   How is this treated? This condition is treated with medicines called antifungals. These medicines prevent a fungus from growing. The medicines are either put right on the area (topical) or swallowed (oral). Your doctor will also treat other problems that you may have, such as diabetes or HIV. Follow these instructions at home: Medicines  Take or use over-the-counter and prescription medicines only as told by your doctor.  Ask your doctor about an over-the-counter medicine called gentian violet. Helping with pain and soreness To lessen  your pain:  Drink cold liquids, like water and iced tea.  Eat frozen ice pops or frozen juices.  Eat foods that are easy to swallow, like gelatin and ice cream.  Drink from a straw if you have too much pain in your mouth.   General instructions  Eat plain yogurt that has live cultures in it. Read the label to make sure that there are live cultures in your yogurt.  If you wear false teeth: ? Take them out before you go to bed. ? Brush them well. ? Soak them in a cleaner.  Rinse your mouth with warm salt-water many times a day. To make the salt-water mixture, dissolve -1 teaspoon (3-6 g) of salt in 1 cup (237 mL) of warm water. Contact a doctor if:  Your problems do not get better within 7 days of treatment.  Your infection is spreading. This may show as white areas on the skin outside of your mouth.  You are nursing your baby and you have redness and pain in the nipples. Summary  Oral thrush is an infection in your mouth and throat. It is caused by a fungus.  You are more likely to get this condition if you have a weak body defense system. Diseases like diabetes, cancer, or HIV also add to your risk.  This condition is treated with medicines called antifungals.  Contact a doctor if you do not get better within 7 days of starting treatment. This information  is not intended to replace advice given to you by your health care provider. Make sure you discuss any questions you have with your health care provider. Document Revised: 03/22/2019 Document Reviewed: 03/22/2019 Elsevier Patient Education  [place]. Sore Throat When you have a sore throat, your throat may feel:  Tender.  Burning.  Irritated.  Scratchy.  Painful when you swallow.  Painful when you talk. Many things can cause a sore throat, such as:  An infection.  Allergies.  Dry air.  Smoke or pollution.  Radiation treatment.  Gastroesophageal reflux disease (GERD).  A tumor. A sore  throat can be the first sign of another sickness. It can happen with other problems, like:  Coughing.  Sneezing.  Fever.  Swelling in the neck. Most sore throats go away without treatment. Follow these instructions at home:  Take over-the-counter medicines only as told by your doctor. ? If your child has a sore throat, do not give your child aspirin.  Drink enough fluids to keep your pee (urine) pale yellow.  Rest when you feel you need to.  To help with pain: ? Sip warm liquids, such as broth, herbal tea, or warm water. ? Eat or drink cold or frozen liquids, such as frozen ice pops. ? Gargle with a salt-water mixture 3-4 times a day or as needed. To make a salt-water mixture, add -1 tsp (3-6 g) of salt to 1 cup (237 mL) of warm water. Mix it until you cannot see the salt anymore. ? Suck on hard candy or throat lozenges. ? Put a cool-mist humidifier in your bedroom at night. ? Sit in the bathroom with the door closed for 5-10 minutes while you run hot water in the shower.  Do not use any products that contain nicotine or tobacco, such as cigarettes, e-cigarettes, and chewing tobacco. If you need help quitting, ask your doctor.  Wash your hands well and often with soap and water. If soap and water are not available, use hand sanitizer.      Contact a doctor if:  You have a fever for more than 2-3 days.  You keep having symptoms for more than 2-3 days.  Your throat does not get better in 7 days.  You have a fever and your symptoms suddenly get worse.  Your child who is 3 months to 60 years old has a temperature of 102.75F (39C) or higher. Get help right away if:  You have trouble breathing.  You cannot swallow fluids, soft foods, or your saliva.  You have swelling in your throat or neck that gets worse.  You keep feeling sick to your stomach (nauseous).  You keep throwing up (vomiting). Summary  A sore throat is pain, burning, irritation, or scratchiness in the  throat. Many things can cause a sore throat.  Take over-the-counter medicines only as told by your doctor. Do not give your child aspirin.  Drink plenty of fluids, and rest as needed.  Contact a doctor if your symptoms get worse or your sore throat does not get better within 7 days. This information is not intended to replace advice given to you by your health care provider. Make sure you discuss any questions you have with your health care provider. Document Revised: 10/16/2017 Document Reviewed: 10/16/2017 Elsevier Patient Education  2021 [employer].

## 2020-11-02 NOTE — Assessment & Plan Note (Signed)
Unresolved sore throat symptoms.  With fever, headache, ear pain and red rash in mouth and skin.  Provided education to patient with printed handouts given.  Augmentin 875-125 mg tablet twice daily.  Tylenol/ibuprofen for pain. Follow-up with worsening or unresolved symptoms.  Rx sent to pharmacy.

## 2020-11-02 NOTE — Progress Notes (Signed)
   Virtual Visit  Note Due to COVID-19 pandemic this visit was conducted virtually. This visit type was conducted due to national recommendations for restrictions regarding the COVID-19 Pandemic (e.g. social distancing, sheltering in place) in an effort to limit this patient's exposure and mitigate transmission in our community. All issues noted in this document were discussed and addressed.  A physical exam was not performed with this format.  I connected with Kara Irwin on 11/02/20 at  11:30 AM by telephone and verified that I am speaking with the correct person using two identifiers. Kara Irwin is currently located at home during visit. The provider, Ivy Lynn, NP is located in their office at time of visit.  I discussed the limitations, risks, security and privacy concerns of performing an evaluation and management service by telephone and the availability of in person appointments. I also discussed with the patient that there may be a patient responsible charge related to this service. The patient expressed understanding and agreed to proceed.   History and Present Illness:  Sore Throat  This is a new problem. The current episode started in the past 7 days. The problem has been unchanged. Neither side of throat is experiencing more pain than the other. The maximum temperature recorded prior to her arrival was 101 - 101.9 F. The pain is moderate. Associated symptoms include ear pain and headaches.  Mouth Lesions  Associated symptoms include a fever, ear pain, headaches, mouth sores, sore throat and rash.      Review of Systems  Constitutional: Positive for fever.  HENT: Positive for ear pain, mouth sores and sore throat.   Respiratory: Negative.   Cardiovascular: Negative.   Skin: Positive for rash.  Neurological: Positive for headaches.  All other systems reviewed and are negative.    Observations/Objective: Televisit patient did not sound to be in  distress.  Assessment and Plan:  Thrush, oral Patient is reporting worsening oral thrush in the last few days.  With ear hurting, sore throat, headache and fever of 100.1.  Started patient on nystatin mouth rinse, Tylenol/ibuprofen for pain and fever.Completed rapid strep results pending.    Sorethroat Unresolved sore throat symptoms.  With fever, headache, ear pain and red rash in mouth and skin.  Provided education to patient with printed handouts given.  Augmentin 875-125 mg tablet twice daily.  Tylenol/ibuprofen for pain. Follow-up with worsening or unresolved symptoms.  Rx sent to pharmacy. Follow Up Instructions:    Follow-up with unresolved symptoms   I discussed the assessment and treatment plan with the patient. The patient was provided an opportunity to ask questions and all were answered. The patient agreed with the plan and demonstrated an understanding of the instructions.   The patient was advised to call back or seek an in-person evaluation if the symptoms worsen or if the condition fails to improve as anticipated.  The above assessment and management plan was discussed with the patient. The patient verbalized understanding of and has agreed to the management plan. Patient is aware to call the clinic if symptoms persist or worsen. Patient is aware when to return to the clinic for a follow-up visit. Patient educated on when it is appropriate to go to the emergency department.   Time call ended: 11:40 AM  I provided 10 minutes of  non face-to-face time during this encounter.    Ivy Lynn, NP

## 2020-11-03 ENCOUNTER — Telehealth: Payer: Self-pay | Admitting: Family Medicine

## 2020-11-03 ENCOUNTER — Encounter: Payer: Self-pay | Admitting: Family Medicine

## 2020-11-03 ENCOUNTER — Other Ambulatory Visit: Payer: Self-pay | Admitting: Nurse Practitioner

## 2020-11-03 MED ORDER — CEPHALEXIN 500 MG PO CAPS: 500.0000 mg | ORAL_CAPSULE | Freq: Two times a day (BID) | ORAL | 0 refills | Status: DC

## 2020-11-03 NOTE — Telephone Encounter (Signed)
Patient seen JE yesterday   Also mychart message was sent   Please advise

## 2020-11-03 NOTE — Telephone Encounter (Signed)
Patient aware.

## 2020-11-03 NOTE — Telephone Encounter (Signed)
Pt does not want to take a z pack because she already has heart issues and would prefer to take kflex instead. Please call back and advise.

## 2020-11-03 NOTE — Telephone Encounter (Signed)
Pt says she is not allergic to Keflex and she is actually currently taking it for an ulcer in her mouth right now (500 mg) that she is taking 4x daily but only has a few pills left. Said she told provider this yesterday. Just needs a refill on the keflex. Does not want to take Zpak. Please advise and call pt.

## 2020-12-20 ENCOUNTER — Encounter: Payer: Self-pay | Admitting: Family Medicine

## 2020-12-21 ENCOUNTER — Other Ambulatory Visit: Payer: Self-pay | Admitting: Family Medicine

## 2020-12-21 NOTE — Telephone Encounter (Signed)
She needs to be seen for this. My acute day would be appropriate, if nothing available sooner.

## 2020-12-24 ENCOUNTER — Other Ambulatory Visit: Payer: Self-pay

## 2020-12-24 ENCOUNTER — Encounter: Payer: Self-pay | Admitting: Family Medicine

## 2020-12-24 ENCOUNTER — Ambulatory Visit: Payer: Managed Care, Other (non HMO) | Admitting: Family Medicine

## 2020-12-24 VITALS — BP 107/77 | HR 80 | Temp 98.0°F | Ht 63.0 in | Wt 192.2 lb

## 2020-12-24 DIAGNOSIS — R1084 Generalized abdominal pain: Secondary | ICD-10-CM

## 2020-12-24 DIAGNOSIS — R109 Unspecified abdominal pain: Secondary | ICD-10-CM | POA: Diagnosis not present

## 2020-12-24 DIAGNOSIS — M797 Fibromyalgia: Secondary | ICD-10-CM

## 2020-12-24 NOTE — Progress Notes (Signed)
Subjective:  Patient ID: Kara Irwin, female    DOB: Jan 05, 1986  Age: 35 y.o. MRN: [id number]  CC: GI Problem   HPI Kara Irwin presents for exacerbation of chronic stomach issues. Bloating for 2 weeks. Had three days of diarrhea that ended 3 days ago.  Currently her symptoms are benign.  They do recur randomly.  She is using the Gas-X for the cramping.  Unclear when she is using the Levsin.  Patient saw GI earlier this year and did not get a clear impression of but the work-up was for the diagnosis.  She has been using a probiotic ever since that time.  However she states that it does not seem to have made any difference 1 way or the other.  There is no clear association with food.  At this time she is not having dyspepsia.  There is no known association with foods.  She does mention [place] year or so ago when she was visiting the state of [place].Marland Kitchen0 whether that is associated with the onset of her symptoms  Depression screen [facility] 2/9 12/24/2020 04/13/2020 06/28/2019  Decreased Interest 1 1 0  Down, Depressed, Hopeless 1 1 0  PHQ - 2 Score 2 2 0  Altered sleeping 0 3 -  Tired, decreased energy 1 3 -  Change in appetite 1 3 -  Feeling bad or failure about yourself  0 3 -  Trouble concentrating 0 3 -  Moving slowly or fidgety/restless 0 3 -  Suicidal thoughts 0 0 -  PHQ-9 Score 4 20 -  Difficult doing work/chores Not difficult at all Somewhat difficult -    History Kara Irwin has a past medical history of Hyperlipidemia.   She has a past surgical history that includes wisdom teetjh.   Her family history includes Arthritis in her father; Colon cancer (age of onset: 75) in her mother; Diabetes in her father; Gout in her father; Hyperlipidemia in her father and mother; Hypertension in her father and mother.She reports that she has never smoked. She has never used smokeless tobacco. She reports that she does not drink alcohol and does not use  drugs.    ROS Review of Systems  Constitutional: Negative.   HENT: Negative.    Eyes:  Negative for visual disturbance.  Respiratory:  Negative for shortness of breath.   Cardiovascular:  Negative for chest pain.  Gastrointestinal:  Positive for abdominal distention, abdominal pain, diarrhea, nausea and rectal pain. Negative for blood in stool and vomiting.  Musculoskeletal:  Negative for arthralgias.   Objective:  BP 107/77   Pulse 80   Temp 98 F (36.7 C)   Ht '5\' 3"'  (1.6 m)   Wt 192 lb 3.2 oz (87.2 kg)   SpO2 97%   BMI 34.05 kg/m   BP Readings from Last 3 Encounters:  12/24/20 107/77  07/30/20 116/80  04/13/20 120/80    Wt Readings from Last 3 Encounters:  12/24/20 192 lb 3.2 oz (87.2 kg)  07/30/20 198 lb 9.6 oz (90.1 kg)  04/13/20 191 lb 12.8 oz (87 kg)     Physical Exam Constitutional:      Appearance: She is well-developed.  HENT:     Head: Normocephalic and atraumatic.  Cardiovascular:     Rate and Rhythm: Normal rate and regular rhythm.     Heart sounds: No murmur heard. Pulmonary:     Effort: Pulmonary effort is normal.     Breath sounds: Normal breath sounds.  Abdominal:     General: Bowel sounds are normal.     Palpations: Abdomen is soft. There is no mass.     Tenderness: There is abdominal tenderness (minimal, diffuse). There is no guarding or rebound.  Skin:    General: Skin is warm and dry.  Neurological:     Mental Status: She is alert and oriented to person, place, and time.  Psychiatric:        Behavior: Behavior normal.      Assessment & Plan:   Amerie was seen today for gi problem.  Diagnoses and all orders for this visit:  Generalized abdominal pain -     CBC with Differential/Platelet -     CMP14+EGFR -     Lipase -     Cancel: Clostridium difficile EIA -     Celiac Panel -     Giardia/Cryptosporidium EIA -     Fecal occult blood, imunochemical; Future -     Clostridium Difficile by PCR([facility]) -     Fecal  occult blood, imunochemical  Abdominal spasms  Fibromyalgia      I have discontinued Era S. Boden's predniSONE and cephALEXin. I am also having her maintain her OVER THE COUNTER MEDICATION, cetirizine, Vitamin D (Ergocalciferol), hyoscyamine, OVER THE COUNTER MEDICATION, nystatin, and rosuvastatin.  Allergies as of 12/24/2020       Reactions   Duloxetine Hcl Nausea And Vomiting   Amoxicillin-pot Clavulanate    Severe yeast infection   Ciprofloxacin Nausea Only   Estrogens    Birth control patch caused severe reaction        Medication List        Accurate as of December 24, 2020 11:59 PM. If you have any questions, ask your nurse or doctor.          STOP taking these medications    cephALEXin 500 MG capsule Commonly known as: Keflex Stopped by: Kara Fraise, MD   predniSONE 10 MG tablet Commonly known as: DELTASONE Stopped by: Kara Fraise, MD       TAKE these medications    cetirizine 10 MG tablet Commonly known as: ZYRTEC Take 1 tablet (10 mg total) by mouth daily.   hyoscyamine 0.125 MG tablet Commonly known as: LEVSIN Take 0.125 mg by mouth every 4 (four) hours as needed.   nystatin 100000 UNIT/ML suspension Commonly known as: MYCOSTATIN Take 5 mLs (500,000 Units total) by mouth 4 (four) times daily.   OVER THE COUNTER MEDICATION [place]   OVER THE COUNTER MEDICATION Selgevity for stress   rosuvastatin 10 MG tablet Commonly known as: CRESTOR Take 1 tablet (10 mg total) by mouth daily. (NEEDS TO BE SEEN BEFORE NEXT REFILL)   Vitamin D (Ergocalciferol) 1.25 MG (50000 UNIT) Caps capsule Commonly known as: DRISDOL Take 1 capsule (50,000 Units total) by mouth every 7 (seven) days.         Follow-up: No follow-ups on file.  Kara Irwin, M.D.

## 2020-12-24 NOTE — Patient Instructions (Signed)
Gluten-Free Diet for Celiac Disease, Adult  The gluten-free diet includes all foods that do not contain gluten. Gluten is a protein that is found in wheat, rye, barley, and some other grains. Following the gluten-free diet is the only treatment for people with celiac disease. It helps to prevent damage to the intestines andimproves or eliminates the symptoms of celiac disease. Following the gluten-free diet requires some planning. It can be challenging at first, but it gets easier with time and practice. There are more gluten-free options available today than ever before. If you need help finding gluten-free foods or if you have questions, talk with your dietitian or your health careprovider. What are tips for following this plan? Reading food labels Read all food labels. Gluten is often added to foods. Always check the ingredient list and look for warnings, such as "may contain gluten." Foods that list any of these key words on the label usually contain gluten: Wheat, flour, enriched flour, bromated flour, white flour, durum flour, graham flour, phosphated flour, self-rising flour, semolina, farina, barley (malt), rye, and oats. Starch, dextrin, modified food starch, or cereal. Thickening, fillers, or emulsifiers. Malt flavoring, malt extract, or malt syrup. Hydrolyzed vegetable protein. In the [place] packaged foods that are gluten-free are required to be labeled "GF." These foods should be easy to identify and are safe to eat. In the [place] food companies are also required to list common food allergens, includingwheat, on their labels. Shopping When grocery shopping, start by shopping in the produce, meat, and dairy sections. These sections are more likely to contain gluten-free foods. Thenmove to the aisles that contain packaged foods if you need to. Meal planning All fruits, vegetables, and meats are safe to eat and do not contain gluten. Talk with your dietitian or health care provider before  taking a gluten-free multivitamin or mineral supplement. Be aware of gluten-free foods having contact with foods that contain gluten (cross-contamination). This can happen at home and with any processed foods. Talk with your health care provider or dietitian about how to reduce the risk of cross-contamination in your home. If you have questions about how a food is processed, ask the manufacturer. What foods can I eat? Fruits All plain fresh, frozen, canned, and dried fruits, and 100% fruit juices. Vegetables All plain fresh, frozen, and canned vegetables. Grains Amaranth, bean flours, 100% buckwheat flour, corn, millet, nut flours or nut meals, GF oats, quinoa, rice, sorghum, teff, rice wafers, pure cornmealtortillas, popcorn, and hot cereals made from cornmeal.  Hominy, rice, wild rice. Some Asian rice noodles or bean noodles.  Arrowroot starch, corn bran, corn flour, corn germ, cornmeal, corn starch, potato flour, potato starch flour, and rice bran. Plain, brown, and sweet riceflours. Rice polish, soy flour, and tapioca starch. Meats and other protein foods All fresh beef, pork, poultry, fish, seafood, and eggs. Fish canned in water,oil, brine, or vegetable broth. Plain nuts and seeds, peanut butter.  Some precooked or cured meat, such as sausages or meat loaves. Somefrankfurters. Dried beans, dried peas, and lentils. Dairy Fresh plain, dry, evaporated, or condensed milk. Cream, butter, sour cream, whipping cream, and most yogurts. Unprocessed cheese, most processed cheeses,some cottage cheeses, some cream cheeses. Beverages Coffee, tea, most herbal teas. Carbonated beverages and some root beers. Wine,sake, and distilled spirits, such as gin, vodka, and whiskey. Most hard ciders. Fats and oils Butter, margarine, vegetable oil, hydrogenated butter, olive oil, shortening,lard, cream, and some mayonnaise. Some commercial salad dressings. Olives. Sweets and desserts Sugar, honey, some syrups,  molasses, jelly, and jam. Plain hard candy,marshmallows, and gumdrops.  Pure cocoa powder. Plain chocolate. Custard and some pudding mixes. Gelatindesserts, sorbets, frozen ice pops, and sherbet.  Cake, cookies, and other desserts prepared with allowed flours. Some commercialice creams. Cornstarch, tapioca, and rice puddings. Seasoning and other foods Some canned or frozen soups. Monosodium glutamate (MSG). Cider, rice, and winevinegar. Baking soda and baking powder.  Cream of tartar. Baking and nutritional yeast. Certain soy sauces made withoutwheat (ask your dietitian about specific brands that are allowed).  Nuts, coconut, and chocolate. Salt, pepper, herbs, spices, flavoring extracts,imitation or artificial flavorings, natural flavorings, and food colorings.  Some medicines and supplements. Rice syrups. The items listed above may not be a complete list of foods and beverages you can eat. Contact a dietitian for more information. What foods should I avoid? Fruits Thickened or prepared fruits and some pie fillings. Some fruit snacks and fruitroll-ups. Vegetables Most creamed vegetables and most vegetables canned in sauces. Some commerciallyprepared vegetables and salads. Vegetables in a soy sauce marinade or dressing. Grains Barley, bran, bulgur, couscous, cracked wheat, Omar, farro, graham, malt, matzo, semolina, wheat germ, and all wheat and rye cereals including spelt andkamut.  Cereals containing malt as a flavoring, such as rice cereal. Noodles, spaghetti, macaroni, most packaged rice mixes, and all mixes containing wheat,rye, barley, or triticale. Meats and other protein foods Any meat or meat alternative containing wheat, rye, barley, or gluten stabilizers. These are often marinated or packaged meats, and precooked orcured meat, such as sausages or meat loaves. Bread-containing products, such as Swiss steak, croquettes, meatballs, and meatloaf. Most tuna canned in vegetable broth and  [place] with hydrolyzedvegetable protein (HVP) injected as part of the basting. Seitan. Imitation fish. Eggs in sauces made from ingredients to avoid. Dairy Commercial chocolate milk drinks and malted milk. Some non-dairy creamers. Anycheese product containing ingredients to avoid. Beverages Certain cereal beverages. Beer, ale, malted milk, and some root beers. Some hard ciders. Some instant flavored coffees. Some herbal teas made with barleyor with barley malt added. Fats and oils Some commercial salad dressings. Sour cream containing modified food starch. Sweets and desserts Some toffees. Chocolate-coated nuts (may be rolled in wheat flour) and somecommercial candies and candy bars.  Most cakes, cookies, donuts, pastries, and other baked goods. Some commercialice cream. Ice cream cones.  Commercially prepared mixes for cakes, cookies, and other desserts. Breadpudding and other puddings thickened with flour.  Products containing brown rice syrup made with barley malt enzyme. Desserts andsweets made with malt flavoring. Seasoning and other foods Some curry powders, some dry seasoning mixes, some gravy extracts, some meatsauces, some ketchups, some prepared mustards, and horseradish.  Certain soy sauces. Malt vinegar. Bouillon and bouillon cubes that contain HVP.Some chip dips, and some chewing gum.  Yeast extract. Brewer's yeast. Caramel color.  Some medicines and supplements. The items listed above may not be a complete list of foods and beverages you should avoid. Contact a dietitian for more information. Summary Gluten is a protein that is found in wheat, rye, barley, and some other grains. The gluten-free diet includes all foods that do not contain gluten. If you need help finding gluten-free foods or if you have questions, talk with your dietitian or your health care provider. Read all food labels. Gluten is often added to foods. Always check the ingredient list and look for warnings, such  as "may contain gluten." This information is not intended to replace advice given to you by your health care provider. Make sure you  discuss any questions you have with your healthcare provider. Document Revised: 05/20/2019 Document Reviewed: 05/20/2019 Elsevier Patient Education  2022 [employer].

## 2020-12-25 ENCOUNTER — Encounter: Payer: Self-pay | Admitting: Family Medicine

## 2020-12-25 LAB — GIARDIA/CRYPTOSPORIDIUM EIA
Cryptosporidium EIA: NEGATIVE
Giardia Ag, Stl: NEGATIVE

## 2020-12-25 LAB — CLOSTRIDIUM DIFFICILE BY PCR: Toxigenic C. Difficile by PCR: NEGATIVE

## 2020-12-26 LAB — FECAL OCCULT BLOOD, IMMUNOCHEMICAL: Fecal Occult Bld: NEGATIVE

## 2020-12-27 LAB — GLIA (IGA/G) + TTG IGA
Antigliadin Abs, IgA: 3 units (ref 0–19)
Gliadin IgG: 2 units (ref 0–19)
Transglutaminase IgA: <2 U/mL (ref 0–3)

## 2020-12-27 LAB — CBC WITH DIFFERENTIAL/PLATELET
Basophils Absolute: 0 10*3/uL (ref 0.0–0.2)
Basos: 0 %
EOS (ABSOLUTE): 0.1 10*3/uL (ref 0.0–0.4)
Eos: 2 %
Hematocrit: 42 % (ref 34.0–46.6)
Hemoglobin: 14.2 g/dL (ref 11.1–15.9)
Immature Grans (Abs): 0 10*3/uL (ref 0.0–0.1)
Immature Granulocytes: 0 %
Lymphocytes Absolute: 1.7 10*3/uL (ref 0.7–3.1)
Lymphs: 28 %
MCH: 30.3 pg (ref 26.6–33.0)
MCHC: 33.8 g/dL (ref 31.5–35.7)
MCV: 90 fL (ref 79–97)
Monocytes Absolute: 0.4 10*3/uL (ref 0.1–0.9)
Monocytes: 8 %
Neutrophils Absolute: 3.7 10*3/uL (ref 1.4–7.0)
Neutrophils: 62 %
Platelets: 280 10*3/uL (ref 150–450)
RBC: 4.68 x10E6/uL (ref 3.77–5.28)
RDW: 12.5 % (ref 11.7–15.4)
WBC: 5.9 10*3/uL (ref 3.4–10.8)

## 2020-12-27 LAB — CMP14+EGFR
ALT: 47 IU/L — ABNORMAL HIGH (ref 0–32)
AST: 22 IU/L (ref 0–40)
Albumin/Globulin Ratio: 1.6 (ref 1.2–2.2)
Albumin: 4.4 g/dL (ref 3.8–4.8)
Alkaline Phosphatase: 80 IU/L (ref 44–121)
BUN/Creatinine Ratio: 15 (ref 9–23)
BUN: 11 mg/dL (ref 6–20)
Bilirubin Total: 0.3 mg/dL (ref 0.0–1.2)
CO2: 26 mmol/L (ref 20–29)
Calcium: 9.8 mg/dL (ref 8.7–10.2)
Chloride: 103 mmol/L (ref 96–106)
Creatinine, Ser: 0.71 mg/dL (ref 0.57–1.00)
Globulin, Total: 2.8 g/dL (ref 1.5–4.5)
Glucose: 112 mg/dL — ABNORMAL HIGH (ref 65–99)
Potassium: 5.1 mmol/L (ref 3.5–5.2)
Sodium: 144 mmol/L (ref 134–144)
Total Protein: 7.2 g/dL (ref 6.0–8.5)
eGFR: 113 mL/min/{1.73_m2} (ref >59)

## 2020-12-27 LAB — LIPASE: Lipase: 31 U/L (ref 14–72)

## 2020-12-29 ENCOUNTER — Encounter: Payer: Self-pay | Admitting: Family Medicine

## 2020-12-29 NOTE — Progress Notes (Signed)
Hello [name],  Your lab result is normal and/or stable.Some minor variations that are not significant are commonly marked abnormal, but do not represent any medical problem for you.  Best regards, Claretta Fraise, M.D.

## 2021-01-07 ENCOUNTER — Encounter: Payer: Self-pay | Admitting: Family Medicine

## 2021-03-22 ENCOUNTER — Encounter: Payer: Self-pay | Admitting: Family Medicine

## 2021-03-26 NOTE — Telephone Encounter (Signed)
Pt called to see if Dr Livia Snellen had received her request to resend referral to [facility]. Last time it was sent was in March 2022 but no one every called pt to schedule an appt so she hasnt seen them yet and needs updated referral sent.  Please advise and call patient with update ASAP.

## 2021-03-26 NOTE — Telephone Encounter (Signed)
Per last referral note "Called and spoke with patient she said she will request for referral to get sent to an office closer to her then [facility]." Please advise to put in new referral

## 2021-03-29 ENCOUNTER — Telehealth: Payer: Self-pay | Admitting: Family Medicine

## 2021-03-29 NOTE — Telephone Encounter (Signed)
Was referred back in March but did not follow through, can this be re ordered or does she need to be seen for this?

## 2021-03-29 NOTE — Telephone Encounter (Signed)
Pt calling to check on referral for GI. Please call back and advise.

## 2021-03-30 NOTE — Telephone Encounter (Signed)
APPOINTMENT SCHEDULED

## 2021-03-30 NOTE — Telephone Encounter (Signed)
Pt. Needs to be seen for this. Thanks, WS 

## 2021-03-31 ENCOUNTER — Ambulatory Visit: Payer: Medicaid Other | Admitting: Family Medicine

## 2021-03-31 ENCOUNTER — Encounter: Payer: Self-pay | Admitting: Family Medicine

## 2021-03-31 ENCOUNTER — Other Ambulatory Visit: Payer: Self-pay

## 2021-03-31 VITALS — BP 104/68 | HR 87 | Temp 98.2°F | Ht 63.0 in | Wt 200.2 lb

## 2021-03-31 DIAGNOSIS — R109 Unspecified abdominal pain: Secondary | ICD-10-CM | POA: Diagnosis not present

## 2021-03-31 DIAGNOSIS — M2550 Pain in unspecified joint: Secondary | ICD-10-CM

## 2021-03-31 NOTE — Progress Notes (Signed)
Subjective:  Patient ID: Kara Irwin, female    DOB: [birth year]  Age: 35 y.o. MRN: [id number]  CC: Referral   HPI Kara Irwin presents for awakens in pain and nausea. Nausea and loose stool through the day. Gets cramp followed by fecal urgency. Nausea started 1 month ago. Doing probiotic. Pepto, Gas Ex. Ginger Ale helps the nausea. She eats and then it reoccurs with nausea and indigestion. Getting cramping and intermittent loose stools. Stools q 1-2 days.    Fibromyalgia. Wants to see rheumatology due to pain with awakening. Can't make a fits. Feet tingle. Whole body throbs.  Depression screen [facility] 2/9 03/31/2021 12/24/2020 04/13/2020  Decreased Interest 1 1 1   Down, Depressed, Hopeless 0 1 1  PHQ - 2 Score 1 2 2   Altered sleeping 1 0 3  Tired, decreased energy 3 1 3   Change in appetite 0 1 3  Feeling bad or failure about yourself  0 0 3  Trouble concentrating 0 0 3  Moving slowly or fidgety/restless 0 0 3  Suicidal thoughts 0 0 0  PHQ-9 Score 5 4 20   Difficult doing work/chores Not difficult at all Not difficult at all Somewhat difficult    History Kara Irwin has a past medical history of Hyperlipidemia.   She has a past surgical history that includes wisdom teetjh.   Her family history includes Arthritis in her father; Colon cancer (age of onset: 25) in her mother; Diabetes in her father; Gout in her father; Hyperlipidemia in her father and mother; Hypertension in her father and mother.She reports that she has never smoked. She has never used smokeless tobacco. She reports that she does not drink alcohol and does not use drugs.    ROS Review of Systems  Constitutional: Negative.   HENT: Negative.    Eyes:  Negative for visual disturbance.  Respiratory:  Negative for shortness of breath.   Cardiovascular:  Negative for chest pain.  Gastrointestinal:  Positive for abdominal distention and abdominal pain.  Musculoskeletal:  Positive for arthralgias.   Objective:   BP 104/68   Pulse 87   Temp 98.2 F (36.8 C)   Ht 5\' 3"  (1.6 m)   Wt 200 lb 3.2 oz (90.8 kg)   SpO2 96%   BMI 35.46 kg/m   BP Readings from Last 3 Encounters:  03/31/21 104/68  12/24/20 107/77  07/30/20 116/80    Wt Readings from Last 3 Encounters:  03/31/21 200 lb 3.2 oz (90.8 kg)  12/24/20 192 lb 3.2 oz (87.2 kg)  07/30/20 198 lb 9.6 oz (90.1 kg)     Physical Exam Constitutional:      General: She is not in acute distress.    Appearance: Normal appearance. She is obese. She is diaphoretic.  Musculoskeletal:        General: Normal range of motion.  Neurological:     General: No focal deficit present.     Mental Status: She is alert.      Assessment & Plan:   Kara Irwin was seen today for referral.  Diagnoses and all orders for this visit:  Abdominal spasms -     Ambulatory referral to Gastroenterology  Arthralgia, unspecified joint -     Ambulatory referral to Rheumatology      I have discontinued Denice Paradise. Franklin's nystatin. I am also having her maintain her OVER THE COUNTER MEDICATION, cetirizine, Vitamin D (Ergocalciferol), hyoscyamine, OVER THE COUNTER MEDICATION, and rosuvastatin.  Allergies as of 03/31/2021  Reactions   Duloxetine Hcl Nausea And Vomiting   Amoxicillin-pot Clavulanate    Severe yeast infection   Ciprofloxacin Nausea Only   Estrogens    Birth control patch caused severe reaction        Medication List        Accurate as of March 31, 2021  8:42 PM. If you have any questions, ask your nurse or doctor.          STOP taking these medications    nystatin 100000 UNIT/ML suspension Commonly known as: MYCOSTATIN Stopped by: Claretta Fraise, MD       TAKE these medications    cetirizine 10 MG tablet Commonly known as: ZYRTEC Take 1 tablet (10 mg total) by mouth daily.   hyoscyamine 0.125 MG tablet Commonly known as: LEVSIN Take 0.125 mg by mouth every 4 (four) hours as needed.   OVER THE COUNTER  MEDICATION Floragen   OVER THE COUNTER MEDICATION Selgevity for stress   rosuvastatin 10 MG tablet Commonly known as: CRESTOR Take 1 tablet (10 mg total) by mouth daily. (NEEDS TO BE SEEN BEFORE NEXT REFILL)   Vitamin D (Ergocalciferol) 1.25 MG (50000 UNIT) Caps capsule Commonly known as: DRISDOL Take 1 capsule (50,000 Units total) by mouth every 7 (seven) days.         Follow-up: No follow-ups on file.  Claretta Fraise, M.D.

## 2021-04-02 ENCOUNTER — Telehealth: Payer: Self-pay | Admitting: Family Medicine

## 2021-04-12 ENCOUNTER — Other Ambulatory Visit: Payer: Self-pay | Admitting: Family Medicine

## 2021-04-12 ENCOUNTER — Encounter: Payer: Self-pay | Admitting: Family Medicine

## 2021-04-12 DIAGNOSIS — R1084 Generalized abdominal pain: Secondary | ICD-10-CM

## 2021-04-12 DIAGNOSIS — R109 Unspecified abdominal pain: Secondary | ICD-10-CM

## 2021-04-12 NOTE — Telephone Encounter (Signed)
Referral placed, as requested WS 

## 2021-04-15 ENCOUNTER — Telehealth: Payer: Self-pay | Admitting: *Deleted

## 2021-04-15 NOTE — Telephone Encounter (Signed)
Received call from pt. She wants her records transferred to [facility]. She is transferring care. Patient aware will need to sign medical release. She will come by the office to do so. FYI to Dr. Abbey Chatters, Leonia Reeves and Leona.

## 2021-05-07 NOTE — Progress Notes (Signed)
Office Visit Note  Patient: Kara Irwin             Date of Birth: 29-Jan-1986           MRN: [id number]             PCP: Claretta Fraise, MD Referring: Claretta Fraise, MD Visit Date: 05/19/2021 Occupation: @GUAROCC @  Subjective:  Pain in all the muscles and joints.   History of Present Illness: Kara Irwin is a 35 y.o. female seen in consultation per request of her PCP.  According to her at age 15 she started experiencing increased pain and fatigue.  At the time she was seen by rheumatologist and no diagnosis was given.  She states she started taking some natural supplements for a few years and she stopped them.  She states the symptoms have been gradually getting worse and has been much worse in the last 1 year.  She states normal activities cause increased pain the following day that she is lying in bed and has difficulty getting out of the bed.  She states she is unable to function she feels so tired.  She has been experiencing insomnia and fatigue for many years.  She is also experiencing memory loss.  She states her joints hurt and pop.  She is describes discomfort in her cervical spine, lower lumbar region, right elbow, her knee joints she states her hands and feet tingle especially in the morning.  She has not noticed any joint swelling.  She has been using topical agents over her right elbow.  She states the right elbow pain radiates into her right forearm.  Activities of Daily Living:  Patient reports morning stiffness for  1 hour.   Patient Reports nocturnal pain.  Difficulty dressing/grooming: Reports Difficulty climbing stairs: Reports Difficulty getting out of chair: Reports Difficulty using hands for taps, buttons, cutlery, and/or writing: Reports  Review of Systems  Constitutional:  Positive for fatigue. Negative for night sweats, weight gain and weight loss.  HENT:  Negative for trouble swallowing, trouble swallowing, mouth dryness and nose dryness.   Eyes:   Positive for visual disturbance. Negative for pain, redness, itching and dryness.  Respiratory:  Negative for cough, shortness of breath and difficulty breathing.   Cardiovascular:  Negative for chest pain, palpitations, hypertension, irregular heartbeat and swelling in legs/feet.  Gastrointestinal:  Positive for diarrhea. Negative for blood in stool and constipation.  Endocrine: Negative for increased urination.  Genitourinary:  Negative for difficulty urinating and vaginal dryness.  Musculoskeletal:  Positive for joint pain, joint pain, myalgias, morning stiffness and myalgias. Negative for joint swelling, muscle weakness and muscle tenderness.  Skin:  Negative for color change, rash, hair loss, redness, skin tightness, ulcers and sensitivity to sunlight.  Allergic/Immunologic: Negative for susceptible to infections.  Neurological:  Positive for numbness, parasthesias, memory loss and weakness. Negative for dizziness, headaches and night sweats.  Hematological:  Negative for bruising/bleeding tendency and swollen glands.  Psychiatric/Behavioral:  Positive for sleep disturbance. Negative for depressed mood and confusion. The patient is nervous/anxious.    PMFS History:  Patient Active Problem List   Diagnosis Date Noted   Seasonal allergies 05/19/2021   Vitamin D deficiency 05/19/2021   Sorethroat 11/02/2020   Thrush, oral 11/02/2020   Abdominal spasms 07/30/2020   Family history of colon cancer 07/30/2020   Fibromyalgia 09/03/2016   Cyst of tonsil 11/10/2015   Acute recurrent tonsillitis 11/10/2015   SOB (shortness of breath) 05/08/2014   Chest pain 05/08/2014  Neck pain 02/10/2014   Low back pain 02/10/2014   Myalgia 02/10/2014   Arthralgia 02/10/2014   Hyperlipidemia 12/10/2013    Past Medical History:  Diagnosis Date   Hyperlipidemia     Family History  Problem Relation Age of Onset   Hypertension Mother    Hyperlipidemia Mother    Colon cancer Mother 70       Stage 3  s/p surgery and 22+ lymph nodes; no chemo   Hypertension Father    Diabetes Father    Hyperlipidemia Father    Arthritis Father    Gout Father    Lupus Sister    Hypertension Brother    Healthy Son    Past Surgical History:  Procedure Laterality Date   CESAREAN SECTION     WISDOM TOOTH EXTRACTION     Social History   Social History Narrative   Not on file   Immunization History  Administered Date(s) Administered   Tdap 12/10/2013     Objective: Vital Signs: BP 126/81 (BP Location: Right Arm, Patient Position: Sitting, Cuff Size: Normal)   Pulse 69   Ht $R'5\' 2"'iK$  (1.575 m)   Wt 204 lb (92.5 kg)   BMI 37.31 kg/m    Physical Exam Vitals and nursing note reviewed.  Constitutional:      Appearance: She is well-developed.  HENT:     Head: Normocephalic and atraumatic.  Eyes:     Conjunctiva/sclera: Conjunctivae normal.  Cardiovascular:     Rate and Rhythm: Normal rate and regular rhythm.     Heart sounds: Normal heart sounds.  Pulmonary:     Effort: Pulmonary effort is normal.     Breath sounds: Normal breath sounds.  Abdominal:     General: Bowel sounds are normal.     Palpations: Abdomen is soft.  Musculoskeletal:     Cervical back: Normal range of motion.  Lymphadenopathy:     Cervical: No cervical adenopathy.  Skin:    General: Skin is warm and dry.     Capillary Refill: Capillary refill takes less than 2 seconds.  Neurological:     Mental Status: She is alert and oriented to person, place, and time.  Psychiatric:        Behavior: Behavior normal.     Musculoskeletal Exam: C-spine thoracic and lumbar spine were in good range of motion.  She had discomfort range of motion of her thoracic and lumbar spine.  Shoulder joints, elbow joints, wrist joints, MCPs PIPs and DIPs with good range of motion.  She has some tenderness on palpation of her right proximal ulna region.  There was no tenderness over the epicondyle region.  Left elbow joint was in good range of  motion.  No synovitis was noted.  Wrist joints, MCPs PIPs and DIPs with good range of motion without any synovitis.  Hip joints and knee joints with good range of motion without any warmth swelling or effusion.  There was no tenderness over ankles or MTPs.  CDAI Exam: CDAI Score: -- Patient Global: --; Provider Global: -- Swollen: --; Tender: -- Joint Exam 05/19/2021   No joint exam has been documented for this visit   There is currently no information documented on the homunculus. Go to the Rheumatology activity and complete the homunculus joint exam.  Investigation: No additional findings.  Imaging: No results found.  Recent Labs: Lab Results  Component Value Date   WBC 5.9 12/24/2020   HGB 14.2 12/24/2020   PLT 280 12/24/2020   NA  144 12/24/2020   K 5.1 12/24/2020   CL 103 12/24/2020   CO2 26 12/24/2020   GLUCOSE 112 (H) 12/24/2020   BUN 11 12/24/2020   CREATININE 0.71 12/24/2020   BILITOT 0.3 12/24/2020   ALKPHOS 80 12/24/2020   AST 22 12/24/2020   ALT 47 (H) 12/24/2020   PROT 7.2 12/24/2020   ALBUMIN 4.4 12/24/2020   CALCIUM 9.8 12/24/2020   GFRAA 131 04/13/2020    Speciality Comments: No specialty comments available.  Procedures:  No procedures performed Allergies: Duloxetine hcl, Amoxicillin-pot clavulanate, Ciprofloxacin, and Estrogens   Assessment / Plan:     Visit Diagnoses: Polyarthralgia-patient complains of pain and discomfort in multiple joints and muscles since she was 35 years old.  She states the same discomfort is progressively getting worse.  She describes discomfort in her right forearm, bilateral hands, bilateral knees and her feet.  She states she experiences tingling in her hands and feet.  She has discomfort in her entire back.  The symptoms get worse with activities.  She is concerned about autoimmune disease.  There is no history of oral ulcers, nasal ulcers, malar rash, photosensitivity, Raynaud's phenomenon or lymphadenopathy.  There was no  synovitis on my examination.  She is concerned as her sister has been diagnosed with lupus.  I will obtain [facility] labs to rule out any underlying autoimmune process.  I will discuss results when she returns for the follow-up visit.  Right forearm pain -she complains of pain and discomfort in her right forearm.  She had tenderness on palpation over the right proximal ulna region.  She had no tenderness over the epicondyle region.  Plan: XR Forearm Right.  X-rays of the right forearm were unremarkable.  X-ray findings were discussed with the patient.  A handout on forearm exercises was given.  Neck pain-she has been experiencing neck stiffness.  She had good range of motion without discomfort.  Pain in thoracic spine -she has been experiencing thoracic pain.  She had tenderness on palpation over the entire spine plan: XR Thoracic Spine 2 View x-ray showed mild anterior spurring otherwise unremarkable.  X-ray findings were discussed with the patient.  Chronic midline low back pain without sciatica-she has lower back pain off and on.  She had no point tenderness.  There was no SI joint tenderness.  Elevated sed rate -she had mild elevation of sedimentation rate in the past.  No synovitis was noted on the examination.  04/13/20: ESR 33, RF<10, TSH 1.530, dsDNA<1, RNP-, SM-, scl-70-, Ro-, La-, chromatin ab-, antiJo1-, centromere ab-  Fibromyalgia-she was diagnosed with fibromyalgia several years ago.  She has had history of generalized pain, myalgias and arthralgias since she was 35 years old.  Other fatigue-she complains of chronic fatigue.  Primary insomnia-he complains of chronic insomnia related to fatigue.  Good sleep hygiene was discussed.  Mixed hyperlipidemia  Vitamin D deficiency-she is on vitamin D supplement.  Seasonal allergies  Family history of colon cancer - mother  Family history of lupus erythematosus - Sister  Orders: Orders Placed This Encounter  Procedures   XR Thoracic  Spine 2 View   XR Forearm Right   No orders of the defined types were placed in this encounter.   Follow-Up Instructions: Return for Polyarthralgia, myalgia.   Bo Merino, MD  Note - This record has been created using Editor, commissioning.  Chart creation errors have been sought, but may not always  have been located. Such creation errors do not reflect on  the standard  of medical care.

## 2021-05-19 ENCOUNTER — Ambulatory Visit: Payer: Self-pay

## 2021-05-19 ENCOUNTER — Other Ambulatory Visit: Payer: Self-pay

## 2021-05-19 ENCOUNTER — Ambulatory Visit: Payer: Medicaid Other | Admitting: Rheumatology

## 2021-05-19 ENCOUNTER — Encounter: Payer: Self-pay | Admitting: Rheumatology

## 2021-05-19 VITALS — BP 126/81 | HR 69 | Ht 62.0 in | Wt 204.0 lb

## 2021-05-19 DIAGNOSIS — R700 Elevated erythrocyte sedimentation rate: Secondary | ICD-10-CM | POA: Diagnosis not present

## 2021-05-19 DIAGNOSIS — M2550 Pain in unspecified joint: Secondary | ICD-10-CM | POA: Diagnosis not present

## 2021-05-19 DIAGNOSIS — E559 Vitamin D deficiency, unspecified: Secondary | ICD-10-CM

## 2021-05-19 DIAGNOSIS — J302 Other seasonal allergic rhinitis: Secondary | ICD-10-CM | POA: Diagnosis not present

## 2021-05-19 DIAGNOSIS — E782 Mixed hyperlipidemia: Secondary | ICD-10-CM | POA: Diagnosis not present

## 2021-05-19 DIAGNOSIS — R5383 Other fatigue: Secondary | ICD-10-CM | POA: Diagnosis not present

## 2021-05-19 DIAGNOSIS — M542 Cervicalgia: Secondary | ICD-10-CM

## 2021-05-19 DIAGNOSIS — Z840 Family history of diseases of the skin and subcutaneous tissue: Secondary | ICD-10-CM

## 2021-05-19 DIAGNOSIS — M546 Pain in thoracic spine: Secondary | ICD-10-CM | POA: Diagnosis not present

## 2021-05-19 DIAGNOSIS — M79631 Pain in right forearm: Secondary | ICD-10-CM | POA: Diagnosis not present

## 2021-05-19 DIAGNOSIS — M797 Fibromyalgia: Secondary | ICD-10-CM

## 2021-05-19 DIAGNOSIS — F5101 Primary insomnia: Secondary | ICD-10-CM | POA: Diagnosis not present

## 2021-05-19 DIAGNOSIS — M545 Low back pain, unspecified: Secondary | ICD-10-CM | POA: Diagnosis not present

## 2021-05-19 DIAGNOSIS — M791 Myalgia, unspecified site: Secondary | ICD-10-CM

## 2021-05-19 DIAGNOSIS — G8929 Other chronic pain: Secondary | ICD-10-CM

## 2021-05-19 DIAGNOSIS — Z800 Family history of malignant neoplasm of digestive organs: Secondary | ICD-10-CM

## 2021-05-19 NOTE — Patient Instructions (Signed)
Elbow and Forearm Exercises Ask your health care provider which exercises are safe for you. Do exercises exactly as told by your health care provider and adjust them as directed. It is normal to feel mild stretching, pulling, tightness, or discomfort as you do these exercises. Stop right away if you feel sudden pain or your pain gets worse. Do not begin these exercises until told by your health care provider. Range-of-motion exercises These exercises warm up your muscles and joints and improve the movement and flexibility of your injured elbow and forearm. The exercises also help to relieve pain, numbness, and tingling. These exercises are done using the muscles in your injured elbow and forearm (active). Elbow flexion, active Hold your left / right arm at your side, and bend your elbow (flexion) as far as you can using only your arm muscles. Hold this position for __________ seconds. Slowly return to the starting position. Repeat __________ times. Complete this exercise __________ times a day. Elbow extension, active Hold your left / right arm at your side, and straighten your elbow (extension) as much as you can using only your arm muscles. Hold this position for __________ seconds. Slowly return to the starting position. Repeat __________ times. Complete this exercise __________ times a day. Active forearm rotation, supination This is an exercise in which you turn (rotate) your forearm palm up (supination). Stand or sit with your elbows at your sides. Bend your left / right elbow to a 90-degree angle (right angle). Rotate your palm up until you feel a gentle stretch on the inside of your forearm. Hold this position for __________ seconds. Slowly return to the starting position. Repeat __________ times. Complete this exercise __________ times a day. Active forearm rotation, pronation This is an exercise in which you turn (rotate) your forearm palm down (pronation). Stand or sit with your  elbows at your sides. Bend your left / right elbow to a 90-degree angle (right angle). Rotate your palm down until you feel a gentle stretch on the top of your forearm. Hold this position for __________ seconds. Slowly return to the starting position. Repeat __________ times. Complete this exercise __________ times a day. Stretching exercises These exercises warm up your muscles and joints and improve the movement and flexibility of your injured elbow and forearm. These exercises also help to relieve pain, numbness, and tingling. These exercises are done using your healthy elbow and forearm to help stretch the muscles in your injured elbow and forearm (active-assisted). Elbow flexion, active-assisted  Hold your left / right arm at your side, and bend your elbow (flexion) as much as you can using your left / right arm muscles. Use your other hand to bend your left / right elbow farther. To do this, gently push up on your forearm until you feel a gentle stretch on the back of your elbow. Hold this position for __________ seconds. Slowly return to the starting position. Repeat __________ times. Complete this exercise __________ times a day. Elbow extension, active-assisted  Hold your left / right arm at your side, and straighten your elbow (extension) as much as you can using your left / right arm muscles. Use your other hand to straighten the left / right elbow farther. To do this, gently push down on your forearm until you feel a gentle stretch on the inside of your elbow. Hold this position for __________ seconds. Slowly return to the starting position. Repeat __________ times. Complete this exercise __________ times a day. Active-assisted forearm rotation, supination This is  an exercise in which you turn (rotate) your forearm palm up (supination). Sit with your left / right elbow bent in a 90-degree angle (right angle) with your forearm resting on a table. Keeping your upper body and  shoulder still, rotate your forearm so your palm faces upward. Use your other hand to help rotate your forearm further until you feel a gentle to moderate stretch. Hold this position for __________ seconds. Slowly release the stretch and return to the starting position. Repeat __________ times. Complete this exercise __________ times a day. Active-assisted forearm rotation, pronation This is an exercise in which you turn (rotate) your forearm palm down (pronation). Sit with your left / right elbow bent in a 90-degree angle (right angle) with your forearm resting on a table. Keeping your upper body and shoulder still, rotate your forearm so your palm faces the tabletop. Use your other hand to help rotate your forearm further until you feel a gentle to moderate stretch. Hold this position for __________ seconds. Slowly release the stretch and return to the starting position. Repeat __________ times. Complete this exercise __________ times a day. Passive elbow flexion, supine Lie on your back (supine position). Extend your left / right arm up in the air, bracing it with your other hand. Let your left / right hand slowly lower toward your shoulder (passive flexion), while your elbow stays pointed toward the ceiling. You should feel a gentle stretch along the back of your upper arm and elbow. If instructed by your health care provider, you may increase the intensity of your stretch by adding a small wrist weight or hand weight. Hold this position for __________ seconds. Slowly return to the starting position. Repeat __________ times. Complete this exercise __________ times a day. Passive elbow extension, supine  Lie on your back (supine position). Make sure that you are in a comfortable position that lets you relax your arm muscles. Place a folded towel under your left / right upper arm so your elbow and shoulder are at the same height. Straighten your left / right arm so your elbow does not rest  on the bed or towel. Let the weight of your hand stretch your elbow (passive extension). Keep your arm and chest muscles relaxed. You should feel a stretch on the inside of your elbow. If told by your health care provider, you may increase the intensity of your stretch by adding a small wrist weight or hand weight. Hold this position for __________ seconds. Slowly release the stretch. Repeat __________ times. Complete this exercise __________ times a day. Strengthening exercises These exercises build strength and endurance in your elbow and forearm. Endurance is the ability to use your muscles for a long time, even after they get tired. Elbow flexion, isometric  Stand or sit up straight. Bend your left / right elbow in a 90-degree angle (right angle), and keep your forearm at the height of your waist. Your thumb should be pointed toward the ceiling (neutral forearm). Place your other hand on top of your left / right forearm. Gently push down while you resist with your left / right arm (isometric flexion). Push as hard as you can with both arms without causing any pain or movement at your left / right elbow. Hold this position for __________ seconds. Slowly release the tension in both arms. Let your muscles relax completely before you repeat the exercise. Repeat __________ times. Complete this exercise __________ times a day. Elbow extension, isometric  Stand or sit up straight. Place  your left / right arm so your palm faces your abdomen and is at the height of your waist. Place your other hand on the underside of your left / right forearm. Gently push up while you resist with your left / right arm (isometric extension). Push as hard as you can with both arms without causing any pain or movement at your left / right elbow. Hold this position for __________ seconds. Slowly release the tension in both arms. Let your muscles relax completely before you repeat the exercise. Repeat __________ times.  Complete this exercise __________ times a day. Elbow flexion with forearm palm up  Sit on a firm chair without armrests, or stand up. Place your left / right arm at your side with your elbow straight and your palm facing forward. Holding a __________weight or gripping a rubber exercise band or tubing, bend your elbow to bring your hand toward your shoulder (flexion). Hold this position for __________ seconds. Slowly return to the starting position. Repeat __________ times. Complete this exercise __________ times a day. Elbow extension, active  Sit on a firm chair without armrests, or stand up. Hold a rubber exercise band or tubing in both hands. Keeping your upper arms at your sides, bring both hands up to your left / right shoulder. Keep your left / right hand just below your other hand. Straighten your left / right elbow (extension) while keeping your other arm still. Hold this position for __________ seconds. Control the resistance of the band or tubing as you return to the starting position. Repeat __________ times. Complete this exercise __________ times a day. Forearm rotation, supination  Sit with your left / right forearm supported on a table. Your elbow should be at waist height and bent at a 90-degree angle (right angle). Gently grasp a lightweight hammer. Rest your hand over the edge of the table with your palm facing down. Without moving your left / right elbow, slowly rotate your forearm to turn your palm up toward the ceiling (supination). Hold this position for __________ seconds. Slowly return to the starting position. Repeat __________ times. Complete this exercise __________ times a day. Forearm rotation, pronation  Sit with your left / right forearm supported on a table. Keep your elbow below shoulder height. Gently grasp a lightweight hammer. Rest your hand over the edge of the table with your palm facing up. Without moving your left / right elbow, slowly rotate  your forearm to turn your palm down toward the floor (pronation). Hold this position for __________seconds. Slowly return to the starting position. Repeat __________ times. Complete this exercise __________ times a day. This information is not intended to replace advice given to you by your health care provider. Make sure you discuss any questions you have with your health care provider. Document Revised: 09/06/2018 Document Reviewed: 06/06/2018 [employer] Patient Education  [place]. Back Exercises The following exercises strengthen the muscles that help to support the trunk (torso) and back. They also help to keep the lower back flexible. Doing these exercises can help to prevent or lessen existing low back pain. If you have back pain or discomfort, try doing these exercises 2-3 times each day or as told by your health care provider. As your pain improves, do them once each day, but increase the number of times that you repeat the steps for each exercise (do more repetitions). To prevent the recurrence of back pain, continue to do these exercises once each day or as told by your health  care provider. Do exercises exactly as told by your health care provider and adjust them as directed. It is normal to feel mild stretching, pulling, tightness, or discomfort as you do these exercises, but you should stop right away if you feel sudden pain or your pain gets worse. Exercises Single knee to chest Repeat these steps 3-5 times for each leg: Lie on your back on a firm bed or the floor with your legs extended. Bring one knee to your chest. Your other leg should stay extended and in contact with the floor. Hold your knee in place by grabbing your knee or thigh with both hands and hold. Pull on your knee until you feel a gentle stretch in your lower back or buttocks. Hold the stretch for 10-30 seconds. Slowly release and straighten your leg.  Pelvic tilt Repeat these steps 5-10 times: Lie on  your back on a firm bed or the floor with your legs extended. Bend your knees so they are pointing toward the ceiling and your feet are flat on the floor. Tighten your lower abdominal muscles to press your lower back against the floor. This motion will tilt your pelvis so your tailbone points up toward the ceiling instead of pointing to your feet or the floor. With gentle tension and even breathing, hold this position for 5-10 seconds.  Cat-cow Repeat these steps until your lower back becomes more flexible: Get into a hands-and-knees position on a firm bed or the floor. Keep your hands under your shoulders, and keep your knees under your hips. You may place padding under your knees for comfort. Let your head hang down toward your chest. Contract your abdominal muscles and point your tailbone toward the floor so your lower back becomes rounded like the back of a cat. Hold this position for 5 seconds. Slowly lift your head, let your abdominal muscles relax, and point your tailbone up toward the ceiling so your back forms a sagging arch like the back of a cow. Hold this position for 5 seconds.  Press-ups Repeat these steps 5-10 times: Lie on your abdomen (face-down) on a firm bed or the floor. Place your palms near your head, about shoulder-width apart. Keeping your back as relaxed as possible and keeping your hips on the floor, slowly straighten your arms to raise the top half of your body and lift your shoulders. Do not use your back muscles to raise your upper torso. You may adjust the placement of your hands to make yourself more comfortable. Hold this position for 5 seconds while you keep your back relaxed. Slowly return to lying flat on the floor.  Bridges Repeat these steps 10 times: Lie on your back on a firm bed or the floor. Bend your knees so they are pointing toward the ceiling and your feet are flat on the floor. Your arms should be flat at your sides, next to your body. Tighten  your buttocks muscles and lift your buttocks off the floor until your waist is at almost the same height as your knees. You should feel the muscles working in your buttocks and the back of your thighs. If you do not feel these muscles, slide your feet 1-2 inches (2.5-5 cm) farther away from your buttocks. Hold this position for 3-5 seconds. Slowly lower your hips to the starting position, and allow your buttocks muscles to relax completely. If this exercise is too easy, try doing it with your arms crossed over your chest. Abdominal crunches Repeat these steps 5-10  times: Lie on your back on a firm bed or the floor with your legs extended. Bend your knees so they are pointing toward the ceiling and your feet are flat on the floor. Cross your arms over your chest. Tip your chin slightly toward your chest without bending your neck. Tighten your abdominal muscles and slowly raise your torso high enough to lift your shoulder blades a tiny bit off the floor. Avoid raising your torso higher than that because it can put too much stress on your lower back and does not help to strengthen your abdominal muscles. Slowly return to your starting position.  Back lifts Repeat these steps 5-10 times: Lie on your abdomen (face-down) with your arms at your sides, and rest your forehead on the floor. Tighten the muscles in your legs and your buttocks. Slowly lift your chest off the floor while you keep your hips pressed to the floor. Keep the back of your head in line with the curve in your back. Your eyes should be looking at the floor. Hold this position for 3-5 seconds. Slowly return to your starting position.  Contact a health care provider if: Your back pain or discomfort gets much worse when you do an exercise. Your worsening back pain or discomfort does not lessen within 2 hours after you exercise. If you have any of these problems, stop doing these exercises right away. Do not do them again unless your  health care provider says that you can. Get help right away if: You develop sudden, severe back pain. If this happens, stop doing the exercises right away. Do not do them again unless your health care provider says that you can. This information is not intended to replace advice given to you by your health care provider. Make sure you discuss any questions you have with your health care provider. Document Revised: 11/10/2020 Document Reviewed: 07/29/2020 [employer] Patient Education  [place].

## 2021-05-28 NOTE — Progress Notes (Signed)
Office Visit Note  Patient: Kara Irwin             Date of Birth: 1985/09/07           MRN: [id number]             PCP: Claretta Fraise, MD Referring: Claretta Fraise, MD Visit Date: 06/10/2021 Occupation: _0 @  Subjective:  Pain in multiple joints and muscles.   History of Present Illness: KAZUKO CLEMENCE is a 35 y.o. female with a history of pain in multiple joints and muscles.  She states she continues to have pain and discomfort in her neck, lower back, shoulders, elbows, hips and her knees.  She has not noticed any joint swelling.  She continues to have generalized muscle pain.  She continues to have fatigue and insomnia.  Activities of Daily Living:  Patient reports morning stiffness for 30 minutes.   Patient Denies nocturnal pain.  Difficulty dressing/grooming: Reports Difficulty climbing stairs: Denies Difficulty getting out of chair: Denies Difficulty using hands for taps, buttons, cutlery, and/or writing: Reports  Review of Systems  Constitutional:  Positive for fatigue.  HENT:  Positive for mouth dryness.   Eyes:  Negative for dryness.  Respiratory:  Negative for shortness of breath.   Cardiovascular:  Negative for swelling in legs/feet.  Gastrointestinal:  Positive for constipation and diarrhea.  Endocrine: Positive for heat intolerance.  Genitourinary:  Negative for difficulty urinating.  Musculoskeletal:  Positive for joint pain, joint pain, muscle weakness, morning stiffness and muscle tenderness.  Skin:  Negative for rash.  Allergic/Immunologic: Negative for susceptible to infections.  Neurological:  Positive for numbness.  Hematological:  Negative for bruising/bleeding tendency.  Psychiatric/Behavioral:  Positive for sleep disturbance.    PMFS History:  Patient Active Problem List   Diagnosis Date Noted   Seasonal allergies 05/19/2021   Vitamin D deficiency 05/19/2021   Sorethroat 11/02/2020   Thrush, oral 11/02/2020   Abdominal spasms  07/30/2020   Family history of colon cancer 07/30/2020   Fibromyalgia 09/03/2016   Cyst of tonsil 11/10/2015   Acute recurrent tonsillitis 11/10/2015   SOB (shortness of breath) 05/08/2014   Chest pain 05/08/2014   Neck pain 02/10/2014   Low back pain 02/10/2014   Myalgia 02/10/2014   Arthralgia 02/10/2014   Hyperlipidemia 12/10/2013    Past Medical History:  Diagnosis Date   Hyperlipidemia     Family History  Problem Relation Age of Onset   Hypertension Mother    Hyperlipidemia Mother    Colon cancer Mother 4       Stage 3 s/p surgery and 22+ lymph nodes; no chemo   Hypertension Father    Diabetes Father    Hyperlipidemia Father    Arthritis Father    Gout Father    Lupus Sister    Hypertension Brother    Healthy Son    Past Surgical History:  Procedure Laterality Date   CESAREAN SECTION     WISDOM TOOTH EXTRACTION     Social History   Social History Narrative   Not on file   Immunization History  Administered Date(s) Administered   Tdap 12/10/2013     Objective: Vital Signs: BP 112/77 (BP Location: Left Arm, Patient Position: Sitting, Cuff Size: Large)    Pulse 69    Resp 12    Ht _1  (1.575 m)    Wt 203 lb 3.2 oz (92.2 kg)    BMI 37.17 kg/m    Physical Exam Vitals and nursing note  reviewed.  Constitutional:      Appearance: She is well-developed.  HENT:     Head: Normocephalic and atraumatic.  Eyes:     Conjunctiva/sclera: Conjunctivae normal.  Cardiovascular:     Rate and Rhythm: Normal rate and regular rhythm.     Heart sounds: Normal heart sounds.  Pulmonary:     Effort: Pulmonary effort is normal.     Breath sounds: Normal breath sounds.  Abdominal:     General: Bowel sounds are normal.     Palpations: Abdomen is soft.  Musculoskeletal:     Cervical back: Normal range of motion.  Lymphadenopathy:     Cervical: No cervical adenopathy.  Skin:    General: Skin is warm and dry.     Capillary Refill: Capillary refill takes less than 2  seconds.  Neurological:     Mental Status: She is alert and oriented to person, place, and time.  Psychiatric:        Behavior: Behavior normal.     Musculoskeletal Exam: C-spine was in good range of motion.  She had tenderness over bilateral trapezius region.  Shoulder joints, elbow joints, wrist joints, MCPs PIPs and DIPs with good range of motion with no synovitis.  Hip joints, knee joints, ankles, MTPs and PIPs with good range of motion with no synovitis.  She generalized hyperalgesia and positive tender points.  CDAI Exam: CDAI Score: -- Patient Global: --; Provider Global: -- Swollen: --; Tender: -- Joint Exam 06/10/2021   No joint exam has been documented for this visit   There is currently no information documented on the homunculus. Go to the Rheumatology activity and complete the homunculus joint exam.  Investigation: No additional findings.  Imaging: XR Thoracic Spine 2 View  Result Date: 05/19/2021 Mild scoliosis was noted.  Mild anterior spurring was noted.  No significant disc space narrowing was noted. Impression: Unremarkable x-ray of the thoracic spine.  XR Forearm Right  Result Date: 05/19/2021 No joint space narrowing was noted.  No abnormalities in the radius or ulna were noted. Impression: Unremarkable x-ray of the radius and ulna.   Recent Labs: Lab Results  Component Value Date   WBC 5.9 12/24/2020   HGB 14.2 12/24/2020   PLT 280 12/24/2020   NA 144 12/24/2020   K 5.1 12/24/2020   CL 103 12/24/2020   CO2 26 12/24/2020   GLUCOSE 112 (H) 12/24/2020   BUN 11 12/24/2020   CREATININE 0.71 12/24/2020   BILITOT 0.3 12/24/2020   ALKPHOS 80 12/24/2020   AST 22 12/24/2020   ALT 47 (H) 12/24/2020   PROT 7.2 12/24/2020   ALBUMIN 4.4 12/24/2020   CALCIUM 9.8 12/24/2020   GFRAA 131 04/13/2020    04/13/20: ESR 33, RF<10, TSH 1.530, dsDNA<1, RNP-, SM-, scl-70-, Ro-, La-, chromatin ab-, antiJo1-, centromere ab-   May 19, 2021 AVISE lupus index  -2.1, all serology completely negative including ANA, ENA, complements, Jo 1, anticardiolipin, antiphosphatidylserine, beta-2 GP 1, RF, anti-CCP, antithyroglobulin, anti-TPO, anti-carP, antihistone.   Speciality Comments: No specialty comments available.  Procedures:  No procedures performed Allergies: Duloxetine hcl, Amoxicillin-pot clavulanate, Ciprofloxacin, and Estrogens   Assessment / Plan:     Visit Diagnoses: Polyarthralgia - History of polyarthralgia since she was 18.  She describes pain in her right forearm, bilateral hands, bilateral knees and her feet.  AVISE.  No synovitis noted.  All autoimmune work-up is negative.  Lab findings were discussed with the patient at length.  Right forearm pain - History of right  forearm pain and tenderness over proximal ulna.  X-rays were unremarkable.  X-ray findings were discussed with the patient.  Neck pain - History of chronic neck pain.  She had bilateral trapezius spasm most likely related to fibromyalgia.  Pain in thoracic spine - History of chronic thoracic pain.  X-rays obtained at the last visit were unremarkable.  X-ray findings were discussed with the patient.  Chronic midline low back pain without sciatica - Chronic lower back pain.  Elevated sed rate - She had mildly elevated sedimentation rate in the past.  She had no synovitis on examination.  Fibromyalgia - Diagnosed several years ago.  She continues to have generalized pain arthralgias and myalgias.  I detailed discussion with patient regarding fibromyalgia.  She had also clinical features of fibromyalgia.  Need for regular exercise was emphasized.  Benefits from water aerobics, swimming and stretching was discussed.  I will refer her to physical therapy.  Other fatigue - Related to insomnia.  Primary insomnia - Good sleep hygiene discussed.  Seasonal allergies  Vitamin D deficiency  Mixed hyperlipidemia  Family history of colon cancer - Mother  Family history of lupus  erythematosus - Sister, per patient.  Orders: No orders of the defined types were placed in this encounter.  No orders of the defined types were placed in this encounter.    Follow-Up Instructions: Return if symptoms worsen or fail to improve, for Polyarthralgia, myalgia.   Bo Merino, MD  Note - This record has been created using Editor, commissioning.  Chart creation errors have been sought, but may not always  have been located. Such creation errors do not reflect on  the standard of medical care.

## 2021-06-10 ENCOUNTER — Encounter: Payer: Self-pay | Admitting: Rheumatology

## 2021-06-10 ENCOUNTER — Ambulatory Visit (INDEPENDENT_AMBULATORY_CARE_PROVIDER_SITE_OTHER): Payer: Medicaid Other | Admitting: Rheumatology

## 2021-06-10 ENCOUNTER — Other Ambulatory Visit: Payer: Self-pay

## 2021-06-10 ENCOUNTER — Other Ambulatory Visit: Payer: Self-pay | Admitting: *Deleted

## 2021-06-10 VITALS — BP 112/77 | HR 69 | Resp 12 | Ht 62.0 in | Wt 203.2 lb

## 2021-06-10 DIAGNOSIS — M542 Cervicalgia: Secondary | ICD-10-CM

## 2021-06-10 DIAGNOSIS — M2550 Pain in unspecified joint: Secondary | ICD-10-CM | POA: Diagnosis not present

## 2021-06-10 DIAGNOSIS — Z800 Family history of malignant neoplasm of digestive organs: Secondary | ICD-10-CM

## 2021-06-10 DIAGNOSIS — M545 Low back pain, unspecified: Secondary | ICD-10-CM

## 2021-06-10 DIAGNOSIS — J302 Other seasonal allergic rhinitis: Secondary | ICD-10-CM | POA: Diagnosis not present

## 2021-06-10 DIAGNOSIS — M79631 Pain in right forearm: Secondary | ICD-10-CM | POA: Diagnosis not present

## 2021-06-10 DIAGNOSIS — M797 Fibromyalgia: Secondary | ICD-10-CM

## 2021-06-10 DIAGNOSIS — F5101 Primary insomnia: Secondary | ICD-10-CM | POA: Diagnosis not present

## 2021-06-10 DIAGNOSIS — E559 Vitamin D deficiency, unspecified: Secondary | ICD-10-CM

## 2021-06-10 DIAGNOSIS — R700 Elevated erythrocyte sedimentation rate: Secondary | ICD-10-CM

## 2021-06-10 DIAGNOSIS — R5383 Other fatigue: Secondary | ICD-10-CM

## 2021-06-10 DIAGNOSIS — E782 Mixed hyperlipidemia: Secondary | ICD-10-CM | POA: Diagnosis not present

## 2021-06-10 DIAGNOSIS — M546 Pain in thoracic spine: Secondary | ICD-10-CM | POA: Diagnosis not present

## 2021-06-10 DIAGNOSIS — Z840 Family history of diseases of the skin and subcutaneous tissue: Secondary | ICD-10-CM

## 2021-06-10 DIAGNOSIS — G8929 Other chronic pain: Secondary | ICD-10-CM

## 2021-06-11 ENCOUNTER — Telehealth: Payer: Self-pay | Admitting: Rheumatology

## 2021-06-11 NOTE — Telephone Encounter (Signed)
[facility] called the office informing us that they are not contracted with the patient's insurance. They stated they would send [place] a fax with that information as well sometime today (1/13).

## 2021-07-08 DIAGNOSIS — R634 Abnormal weight loss: Secondary | ICD-10-CM | POA: Diagnosis not present

## 2021-07-08 DIAGNOSIS — R194 Change in bowel habit: Secondary | ICD-10-CM | POA: Diagnosis not present

## 2021-07-08 DIAGNOSIS — R109 Unspecified abdominal pain: Secondary | ICD-10-CM | POA: Diagnosis not present

## 2021-07-08 DIAGNOSIS — K219 Gastro-esophageal reflux disease without esophagitis: Secondary | ICD-10-CM | POA: Diagnosis not present

## 2021-07-08 DIAGNOSIS — Z800 Family history of malignant neoplasm of digestive organs: Secondary | ICD-10-CM | POA: Diagnosis not present

## 2021-07-23 DIAGNOSIS — R634 Abnormal weight loss: Secondary | ICD-10-CM | POA: Diagnosis not present

## 2021-07-23 DIAGNOSIS — R109 Unspecified abdominal pain: Secondary | ICD-10-CM | POA: Diagnosis not present

## 2021-07-23 DIAGNOSIS — K219 Gastro-esophageal reflux disease without esophagitis: Secondary | ICD-10-CM | POA: Diagnosis not present

## 2021-07-23 DIAGNOSIS — R194 Change in bowel habit: Secondary | ICD-10-CM | POA: Diagnosis not present

## 2021-08-03 ENCOUNTER — Other Ambulatory Visit: Payer: Self-pay | Admitting: Family Medicine

## 2021-08-04 ENCOUNTER — Encounter: Payer: Self-pay | Admitting: Family Medicine

## 2021-08-04 NOTE — Telephone Encounter (Signed)
Last OV 03/31/2021. Last RF 07/21/20 #13 3RF. Next OV none on file.  Vit D last checked 07/20/20 and was 18. ? ?

## 2021-08-04 NOTE — Telephone Encounter (Signed)
Use Vitamin D 2000 Units OtC daily. Needs OV and blood work before prescribing any further Vit D ?

## 2021-08-04 NOTE — Telephone Encounter (Signed)
Patient needs office visit to recheck vitamin d level before prescribing  Please call patient to schedule. ?

## 2021-08-04 NOTE — Telephone Encounter (Signed)
Letter sent to call and make appt. ?

## 2021-08-11 ENCOUNTER — Encounter: Payer: Self-pay | Admitting: Family Medicine

## 2021-08-11 ENCOUNTER — Ambulatory Visit (INDEPENDENT_AMBULATORY_CARE_PROVIDER_SITE_OTHER): Payer: Medicaid Other | Admitting: Family Medicine

## 2021-08-11 ENCOUNTER — Other Ambulatory Visit: Payer: Self-pay | Admitting: Family Medicine

## 2021-08-11 ENCOUNTER — Telehealth: Payer: Self-pay | Admitting: Family Medicine

## 2021-08-11 VITALS — BP 117/82 | HR 72 | Ht 62.0 in | Wt 198.0 lb

## 2021-08-11 DIAGNOSIS — S161XXA Strain of muscle, fascia and tendon at neck level, initial encounter: Secondary | ICD-10-CM | POA: Diagnosis not present

## 2021-08-11 MED ORDER — VITAMIN D (ERGOCALCIFEROL) 1.25 MG (50000 UNIT) PO CAPS: 50000.0000 [IU] | ORAL_CAPSULE | ORAL | 0 refills | Status: DC

## 2021-08-11 MED ORDER — CYCLOBENZAPRINE HCL 10 MG PO TABS: 10.0000 mg | ORAL_TABLET | Freq: Three times a day (TID) | ORAL | 0 refills | Status: DC | PRN

## 2021-08-11 NOTE — Progress Notes (Signed)
? ?BP 117/82   Pulse 72   Ht '5\' 2"'$  (1.575 m)   Wt 198 lb (89.8 kg)   SpO2 97%   BMI 36.21 kg/m?   ? ?Subjective:  ? ?Patient ID: Kara Irwin, female    DOB: 1985/09/29, 36 y.o.   MRN: [id number] ? ?HPI: ?Kara Irwin is a 36 y.o. female presenting on 08/11/2021 for Neck Pain (Posterior. Radiates up right post scalp. Started Sunday.) ? ? ?HPI ?Patient comes in today complaining of neck pain and neck soreness on the right posterior side of her neck.  She is concerned because she has had shingles once when she was 15 and she has been under a lot of stress recently.  She does admit she was recently at a beach house and slipped on a different bed right for all this happened.  She wanted to get checked out because of the possible concern for shingles.  She has not noticed any rash and her husband has not noticed any rash.  She does describe it as a tingling and pins and needle sensation that is worse to touch and hurts when she lays on it.  She denies any fevers chills or congestion.  She has had some dental infections and she is taking a Z-Pak that she just finished a couple days ago for that. ? ?Relevant past medical, surgical, family and social history reviewed and updated as indicated. Interim medical history since our last visit reviewed. ?Allergies and medications reviewed and updated. ? ?Review of Systems  ?Constitutional:  Negative for chills and fever.  ?Musculoskeletal:  Positive for myalgias and neck pain. Negative for arthralgias and gait problem.  ?Neurological:  Negative for weakness and numbness.  ? ?Per HPI unless specifically indicated above ? ? ?Allergies as of 08/11/2021   ? ?   Reactions  ? Duloxetine Hcl Nausea And Vomiting  ? Amoxicillin-pot Clavulanate   ? Severe yeast infection  ? Ciprofloxacin Nausea Only  ? Estrogens   ? Birth control patch caused severe reaction  ? ?  ? ?  ?Medication List  ?  ? ?  ? Accurate as of August 11, 2021  9:09 AM. If you have any questions, ask your nurse  or doctor.  ?  ?  ? ?  ? ?cetirizine 10 MG tablet ?Commonly known as: ZYRTEC ?Take 1 tablet (10 mg total) by mouth daily. ?  ?cyclobenzaprine 10 MG tablet ?Commonly known as: FLEXERIL ?Take 1 tablet (10 mg total) by mouth 3 (three) times daily as needed for muscle spasms. ?Started by: Worthy Rancher, MD ?  ?hyoscyamine 0.125 MG tablet ?Commonly known as: LEVSIN ?Take 0.125 mg by mouth every 4 (four) hours as needed. ?  ?OVER THE COUNTER MEDICATION ?Floragen ?  ?OVER THE COUNTER MEDICATION ?Selgevity for stress ?  ?rosuvastatin 10 MG tablet ?Commonly known as: CRESTOR ?Take 1 tablet (10 mg total) by mouth daily. (NEEDS TO BE SEEN BEFORE NEXT REFILL) ?  ? ?  ? ? ? ?Objective:  ? ?BP 117/82   Pulse 72   Ht '5\' 2"'$  (1.575 m)   Wt 198 lb (89.8 kg)   SpO2 97%   BMI 36.21 kg/m?   ?Wt Readings from Last 3 Encounters:  ?08/11/21 198 lb (89.8 kg)  ?06/10/21 203 lb 3.2 oz (92.2 kg)  ?05/19/21 204 lb (92.5 kg)  ?  ?Physical Exam ?Vitals and nursing note reviewed.  ?Constitutional:   ?   General: She is not in acute distress. ?  Appearance: She is well-developed. She is not diaphoretic.  ?Eyes:  ?   Conjunctiva/sclera: Conjunctivae normal.  ?Neck:  ? ?Musculoskeletal:     ?   General: No tenderness. Normal range of motion.  ?   Cervical back: Normal range of motion and neck supple. Muscular tenderness present. No pain with movement.  ?Skin: ?   General: Skin is warm and dry.  ?   Findings: No rash.  ?Neurological:  ?   Mental Status: She is alert and oriented to person, place, and time.  ?   Coordination: Coordination normal.  ?Psychiatric:     ?   Behavior: Behavior normal.  ? ? ? ? ?Assessment & Plan:  ? ?Problem List Items Addressed This Visit   ?None ?Visit Diagnoses   ? ? Strain of neck muscle, initial encounter    -  Primary  ? Relevant Medications  ? cyclobenzaprine (FLEXERIL) 10 MG tablet  ? ?  ?Pain on deep pressure not light touch, likely muscular in nature from sleeping in a different location, less likely  shingles based on presentation.  If any rash develops call us immediately we can call in the medicine for shingles, continue to alternate ice and heat like she has been doing and take muscle relaxer as needed ? ?Follow up plan: ?Return if symptoms worsen or fail to improve. ? ?Counseling provided for all of the vaccine components ?No orders of the defined types were placed in this encounter. ? ? ?Caryl Pina, MD ?[place] ?08/11/2021, 9:09 AM ? ? ? ? ?

## 2021-08-11 NOTE — Telephone Encounter (Signed)
Please let the patient know that I sent their prescription to their pharmacy. Thanks, WS 

## 2021-08-11 NOTE — Telephone Encounter (Signed)
07/20/20 Vit D 18.0 Rx ended 07/20/21, not on current med list. Next OV for PE 09/01/21 ?

## 2021-08-11 NOTE — Telephone Encounter (Signed)
?  Prescription Request ? ?08/11/2021 ? ?Is this a "Controlled Substance" medicine? no ? ?Have you seen your PCP in the last 2 weeks? yes ? ?If YES, route message to pool  -  If NO, patient needs to be scheduled for appointment. ? ?What is the name of the medication or equipment? Vitamin D (One month supply) ? ?Have you contacted your pharmacy to request a refill? yes  ? ?Which pharmacy would you like this sent to? [facility] ? ? ?Patient notified that their request is being sent to the clinical staff for review and that they should receive a response within 2 business days.  ?  ?Stacks' pt. ? ?Cpe is scheduled in April. ?

## 2021-08-13 ENCOUNTER — Ambulatory Visit: Payer: Medicaid Other | Admitting: Family Medicine

## 2021-08-13 ENCOUNTER — Encounter: Payer: Self-pay | Admitting: Family Medicine

## 2021-08-13 ENCOUNTER — Ambulatory Visit (HOSPITAL_COMMUNITY)
Admission: RE | Admit: 2021-08-13 | Discharge: 2021-08-13 | Disposition: A | Discharge status: Home / Self Care | Payer: Medicaid Other | Source: Ambulatory Visit | Attending: Family Medicine | Admitting: Family Medicine

## 2021-08-13 ENCOUNTER — Other Ambulatory Visit: Payer: Self-pay

## 2021-08-13 VITALS — BP 124/85 | HR 83 | Temp 97.5°F | Ht 62.0 in | Wt 199.0 lb

## 2021-08-13 DIAGNOSIS — R1032 Left lower quadrant pain: Secondary | ICD-10-CM

## 2021-08-13 DIAGNOSIS — R102 Pelvic and perineal pain: Secondary | ICD-10-CM | POA: Insufficient documentation

## 2021-08-13 NOTE — Progress Notes (Signed)
? ?Assessment & Plan:  ?1. LLQ abdominal pain ?- US Pelvic Complete With Transvaginal; Future ? ?2. Pelvic pain ?- Urinalysis, Routine w reflex microscopic - Urine dipstick shows negative for all components.  Micro exam: not done. ?- US Pelvic Complete With Transvaginal; Future ? ? ?Follow up plan: Return if symptoms worsen or fail to improve. ? ?Hendricks Limes, MSN, APRN, FNP-C ?[place] ? ?Subjective:  ? ?Patient ID: Kara Irwin, female    DOB: [birth year], 36 y.o.   MRN: [id number] ? ?HPI: ?Kara Irwin is a 36 y.o. female presenting on 08/13/2021 for Abdominal Pain (Left lower - started this morning ) and Pelvic Pain (Left sided pelvic pain that started this morning ) ? ?Patient reports left sided abdominal pain down into her pelvis that started this morning. She describes pain as stabbing and constant and rates the pain 7/10 when standing up. Lying down and a heating pad does make the pain somewhat better. She just got off her period last week. She is feeling nauseated due to the pain. Denies vomiting, blood in the urine, flank pain, dysuria, urgency, and frequency. States she drinks lots of water throughout the day. She has been in the bed today due to the pain which is unlike her as she has a son that she is normally running around and playing with.  ? ? ?ROS: Negative unless specifically indicated above in HPI.  ? ?Relevant past medical history reviewed and updated as indicated.  ? ?Allergies and medications reviewed and updated. ? ? ?Current Outpatient Medications:  ?  cetirizine (ZYRTEC) 10 MG tablet, Take 1 tablet (10 mg total) by mouth daily., Disp: 30 tablet, Rfl: 11 ?  cyclobenzaprine (FLEXERIL) 10 MG tablet, Take 1 tablet (10 mg total) by mouth 3 (three) times daily as needed for muscle spasms., Disp: 30 tablet, Rfl: 0 ?  hyoscyamine (LEVSIN) 0.125 MG tablet, Take 0.125 mg by mouth every 4 (four) hours as needed., Disp: , Rfl:  ?  OVER THE COUNTER MEDICATION, Floragen,  Disp: , Rfl:  ?  OVER THE COUNTER MEDICATION, Selgevity for stress, Disp: , Rfl:  ?  rosuvastatin (CRESTOR) 10 MG tablet, Take 1 tablet (10 mg total) by mouth daily. (NEEDS TO BE SEEN BEFORE NEXT REFILL), Disp: 30 tablet, Rfl: 0 ?  Vitamin D, Ergocalciferol, (DRISDOL) 1.25 MG (50000 UNIT) CAPS capsule, Take 1 capsule (50,000 Units total) by mouth every 7 (seven) days., Disp: 13 capsule, Rfl: 0 ? ?Allergies  ?Allergen Reactions  ? Duloxetine Hcl Nausea And Vomiting  ? Amoxicillin Other (See Comments)  ? Amoxicillin-Pot Clavulanate   ?  Severe yeast infection  ? Ciprofloxacin Nausea Only  ? Estrogens   ?  Birth control patch caused severe reaction  ? ? ?Objective:  ? ?BP 124/85   Pulse 83   Temp (!) 97.5 ?F (36.4 ?C) (Temporal)   Ht '5\' 2"'$  (1.575 m)   Wt 199 lb (90.3 kg)   BMI 36.40 kg/m?   ? ?Physical Exam ?Vitals reviewed.  ?Constitutional:   ?   General: She is not in acute distress. ?   Appearance: Normal appearance. She is obese. She is not ill-appearing, toxic-appearing or diaphoretic.  ?HENT:  ?   Head: Normocephalic and atraumatic.  ?Eyes:  ?   General: No scleral icterus.    ?   Right eye: No discharge.     ?   Left eye: No discharge.  ?   Conjunctiva/sclera: Conjunctivae normal.  ?Cardiovascular:  ?  Rate and Rhythm: Normal rate.  ?Pulmonary:  ?   Effort: Pulmonary effort is normal. No respiratory distress.  ?Abdominal:  ?   General: Bowel sounds are normal. There is no distension or abdominal bruit. There are no signs of injury.  ?   Palpations: Abdomen is soft. There is no shifting dullness, fluid wave, hepatomegaly, splenomegaly, mass or pulsatile mass.  ?   Tenderness: There is abdominal tenderness in the left lower quadrant.  ?Musculoskeletal:     ?   General: Normal range of motion.  ?   Cervical back: Normal range of motion.  ?Skin: ?   General: Skin is warm and dry.  ?   Capillary Refill: Capillary refill takes less than 2 seconds.  ?Neurological:  ?   General: No focal deficit present.  ?    Mental Status: She is alert and oriented to person, place, and time. Mental status is at baseline.  ?Psychiatric:     ?   Mood and Affect: Mood normal.     ?   Behavior: Behavior normal.     ?   Thought Content: Thought content normal.     ?   Judgment: Judgment normal.  ? ? ? ? ? ? ?

## 2021-08-16 LAB — URINALYSIS, ROUTINE W REFLEX MICROSCOPIC
Bilirubin, UA: NEGATIVE
Glucose, UA: NEGATIVE
Ketones, UA: NEGATIVE
Leukocytes,UA: NEGATIVE
Nitrite, UA: NEGATIVE
Protein,UA: NEGATIVE
RBC, UA: NEGATIVE
Specific Gravity, UA: 1.01 (ref 1.005–1.030)
Urobilinogen, Ur: 0.2 mg/dL (ref 0.2–1.0)
pH, UA: 7 (ref 5.0–7.5)

## 2021-08-30 ENCOUNTER — Other Ambulatory Visit: Payer: Self-pay | Admitting: *Deleted

## 2021-08-30 ENCOUNTER — Telehealth: Payer: Self-pay | Admitting: Family Medicine

## 2021-08-30 DIAGNOSIS — R0789 Other chest pain: Secondary | ICD-10-CM | POA: Diagnosis not present

## 2021-08-30 DIAGNOSIS — R079 Chest pain, unspecified: Secondary | ICD-10-CM | POA: Diagnosis not present

## 2021-08-30 DIAGNOSIS — R748 Abnormal levels of other serum enzymes: Secondary | ICD-10-CM | POA: Diagnosis not present

## 2021-08-30 DIAGNOSIS — K760 Fatty (change of) liver, not elsewhere classified: Secondary | ICD-10-CM | POA: Diagnosis not present

## 2021-08-30 DIAGNOSIS — Q2548 Anomalous origin of subclavian artery: Secondary | ICD-10-CM | POA: Diagnosis not present

## 2021-08-30 NOTE — Telephone Encounter (Signed)
Pt. Needs to be seen for this. Thanks, WS 

## 2021-08-30 NOTE — Telephone Encounter (Signed)
Called patient, no answer, no option to leave message 

## 2021-08-30 NOTE — Telephone Encounter (Signed)
I spoke to patient. 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  She is particularly concerned about an upcoming  upper endoscopy and if there is any danger or concern with the subclavian artery.  ?

## 2021-08-31 ENCOUNTER — Telehealth: Payer: Self-pay

## 2021-08-31 NOTE — Telephone Encounter (Signed)
Patient has appointment tomorrow

## 2021-08-31 NOTE — Telephone Encounter (Signed)
Transition Care Management Follow-up Telephone Call ?Date of discharge and from where: 08/30/2021-[facility]  ?How have you been since you were released from the hospital? Pt stated she is doing a lot better.  ?Any questions or concerns? No ? ?Items Reviewed: ?Did the pt receive and understand the discharge instructions provided? Yes  ?Medications obtained and verified? Yes  ?Other? No  ?Any new allergies since your discharge? No  ?Dietary orders reviewed? No ?Do you have support at home? Yes  ? ?Home Care and Equipment/Supplies: ?Were home health services ordered? not applicable ?If so, what is the name of the agency? N/A  ?Has the agency set up a time to come to the patient's home? not applicable ?Were any new equipment or medical supplies ordered?  No ?What is the name of the medical supply agency? N/A ?Were you able to get the supplies/equipment? not applicable ?Do you have any questions related to the use of the equipment or supplies? No ? ?Functional Questionnaire: (I = Independent and D = Dependent) ?ADLs: I ? ?Bathing/Dressing- I ? ?Meal Prep- I ? ?Eating- I ? ?Maintaining continence- I ? ?Transferring/Ambulation- I ? ?Managing Meds- I ? ?Follow up appointments reviewed: ? ?PCP Hospital f/u appt confirmed? Yes  Scheduled to see PCP on 08/31/2021. ?[facility] f/u appt confirmed? No   ?Are transportation arrangements needed? No  ?If their condition worsens, is the pt aware to call PCP or go to the Emergency Dept.? Yes ?Was the patient provided with contact information for the PCP's office or ED? Yes ?Was to pt encouraged to call back with questions or concerns? Yes  ?

## 2021-09-01 ENCOUNTER — Encounter: Payer: Self-pay | Admitting: Family Medicine

## 2021-09-01 ENCOUNTER — Ambulatory Visit (INDEPENDENT_AMBULATORY_CARE_PROVIDER_SITE_OTHER): Payer: Medicaid Other | Admitting: Family Medicine

## 2021-09-01 VITALS — BP 107/67 | HR 73 | Temp 97.8°F | Ht 62.0 in | Wt 195.4 lb

## 2021-09-01 DIAGNOSIS — E559 Vitamin D deficiency, unspecified: Secondary | ICD-10-CM

## 2021-09-01 DIAGNOSIS — M797 Fibromyalgia: Secondary | ICD-10-CM

## 2021-09-01 DIAGNOSIS — Z0001 Encounter for general adult medical examination with abnormal findings: Secondary | ICD-10-CM

## 2021-09-01 DIAGNOSIS — R0789 Other chest pain: Secondary | ICD-10-CM | POA: Diagnosis not present

## 2021-09-01 DIAGNOSIS — E782 Mixed hyperlipidemia: Secondary | ICD-10-CM | POA: Diagnosis not present

## 2021-09-01 DIAGNOSIS — Q278 Other specified congenital malformations of peripheral vascular system: Secondary | ICD-10-CM | POA: Diagnosis not present

## 2021-09-01 DIAGNOSIS — Z0000 Encounter for general adult medical examination without abnormal findings: Secondary | ICD-10-CM

## 2021-09-01 NOTE — Progress Notes (Signed)
? ?Subjective:  ?Patient ID: Kara Irwin, female    DOB: 23-Sep-1985  Age: 36 y.o. MRN: [id number] ? ?CC: Annual Exam ? ? ?HPI ?Kara Irwin presents for Annual physical. Recent E.D. trip for chest pain. Squeezing, stabbing. Had CT chest done. Showed aberrant R Subclavian arteryHad CTA, negative for P.E. Was taking a lot of ibuprofen for previous 9 days due toOal Surgery - 4 teeth extracted.  ? ? ?  09/01/2021  ?  1:09 PM 08/11/2021  ?  9:21 AM 08/11/2021  ?  9:20 AM  ?Depression screen PHQ 2/9  ?Decreased Interest 0  0  ?Down, Depressed, Hopeless 0  0  ?PHQ - 2 Score 0  0  ?Altered sleeping  0   ?Tired, decreased energy  1   ?Change in appetite  0   ?Feeling bad or failure about yourself   0   ?Trouble concentrating  0   ?Moving slowly or fidgety/restless  0   ?Suicidal thoughts  0   ? ? ?History ?Kiannah has a past medical history of Hyperlipidemia.  ? ?She has a past surgical history that includes Wisdom tooth extraction and Cesarean section.  ? ?Her family history includes Arthritis in her father; Colon cancer (age of onset: 31) in her mother; Diabetes in her father; Gout in her father; Healthy in her son; Hyperlipidemia in her father and mother; Hypertension in her brother, father, and mother; Lupus in her sister.She reports that she has never smoked. She has never used smokeless tobacco. She reports that she does not drink alcohol and does not use drugs. ? ? ? ?ROS ?Review of Systems  ?Constitutional:  Negative for appetite change, chills, diaphoresis, fatigue, fever and unexpected weight change.  ?HENT:  Negative for congestion, ear pain, hearing loss, postnasal drip, rhinorrhea, sneezing, sore throat and trouble swallowing.   ?Eyes:  Negative for pain and visual disturbance.  ?Respiratory:  Negative for cough, chest tightness and shortness of breath.   ?Cardiovascular:  Positive for chest pain (stabbing). Negative for palpitations.  ?Gastrointestinal:  Negative for abdominal pain, constipation,  diarrhea, nausea and vomiting.  ?Endocrine: Negative for cold intolerance, heat intolerance, polydipsia, polyphagia and polyuria.  ?Genitourinary:  Negative for dysuria, frequency and menstrual problem.  ?Musculoskeletal:  Positive for arthralgias (left hip pain around March 14.). Negative for joint swelling.  ?Skin:  Negative for rash.  ?Allergic/Immunologic: Negative for environmental allergies.  ?Neurological:  Negative for dizziness, weakness, numbness and headaches.  ?Psychiatric/Behavioral:  Positive for sleep disturbance. Negative for agitation and dysphoric mood. The patient is not nervous/anxious.   ? ?Objective:  ?BP 107/67   Pulse 73   Temp 97.8 ?F (36.6 ?C)   Ht '5\' 2"'  (1.575 m)   Wt 195 lb 6.4 oz (88.6 kg)   SpO2 99%   BMI 35.74 kg/m?  ? ?BP Readings from Last 3 Encounters:  ?09/01/21 107/67  ?08/13/21 124/85  ?08/11/21 117/82  ? ? ?Wt Readings from Last 3 Encounters:  ?09/01/21 195 lb 6.4 oz (88.6 kg)  ?08/13/21 199 lb (90.3 kg)  ?08/11/21 198 lb (89.8 kg)  ? ? ? ?Physical Exam ?Vitals and nursing note reviewed. Exam conducted with a chaperone present.  ?Constitutional:   ?   General: She is not in acute distress. ?   Appearance: Normal appearance. She is well-developed.  ?HENT:  ?   Head: Normocephalic and atraumatic.  ?   Right Ear: External ear normal.  ?   Left Ear: External ear normal.  ?  Nose: Nose normal.  ?Eyes:  ?   Conjunctiva/sclera: Conjunctivae normal.  ?   Pupils: Pupils are equal, round, and reactive to light.  ?Neck:  ?   Thyroid: No thyromegaly.  ?Cardiovascular:  ?   Rate and Rhythm: Normal rate and regular rhythm.  ?   Heart sounds: Normal heart sounds. No murmur heard. ?Pulmonary:  ?   Effort: Pulmonary effort is normal. No respiratory distress.  ?   Breath sounds: Normal breath sounds. No wheezing or rales.  ?Chest:  ?Breasts: ?   Breasts are symmetrical.  ?   Right: No inverted nipple, mass or tenderness.  ?   Left: No inverted nipple, mass or tenderness.  ?Abdominal:  ?    General: Bowel sounds are normal. There is no distension or abdominal bruit.  ?   Palpations: Abdomen is soft. There is no hepatomegaly, splenomegaly or mass.  ?   Tenderness: There is no abdominal tenderness. Negative signs include Murphy's sign and McBurney's sign.  ?Musculoskeletal:     ?   General: No tenderness. Normal range of motion.  ?   Cervical back: Normal range of motion and neck supple.  ?Lymphadenopathy:  ?   Cervical: No cervical adenopathy.  ?Skin: ?   General: Skin is warm and dry.  ?   Findings: No rash.  ?Neurological:  ?   Mental Status: She is alert and oriented to person, place, and time.  ?   Deep Tendon Reflexes: Reflexes are normal and symmetric.  ?Psychiatric:     ?   Behavior: Behavior normal.     ?   Thought Content: Thought content normal.     ?   Judgment: Judgment normal.  ? ? ? ? ?Assessment & Plan:  ? ?[name] was seen today for annual exam. ? ?Diagnoses and all orders for this visit: ? ?Well adult exam ?-     CBC with Differential/Platelet ?-     CMP14+EGFR ?-     Urinalysis ? ?Aberrant right subclavian artery ?-     Ambulatory referral to Cardiothoracic Surgery ?-     CBC with Differential/Platelet ?-     CMP14+EGFR ?-     Urinalysis ? ?Vitamin D deficiency ?-     CBC with Differential/Platelet ?-     CMP14+EGFR ?-     VITAMIN D 25 Hydroxy (Vit-D Deficiency, Fractures) ?-     Urinalysis ? ?Fibromyalgia ?-     CBC with Differential/Platelet ?-     CMP14+EGFR ?-     Urinalysis ? ?Other chest pain ?-     CBC with Differential/Platelet ?-     CMP14+EGFR ?-     Urinalysis ? ?Mixed hyperlipidemia ?-     CBC with Differential/Platelet ?-     CMP14+EGFR ?-     Lipid panel ?-     Urinalysis ? ? ? ? ? ? ?I have discontinued Dwan Fennel. Rehmann's hyoscyamine and cyclobenzaprine. I am also having her maintain her OVER THE COUNTER MEDICATION, cetirizine, OVER THE COUNTER MEDICATION, rosuvastatin, Vitamin D (Ergocalciferol), and pantoprazole. ? ?Allergies as of 09/01/2021   ? ?   Reactions  ?  Duloxetine Hcl Nausea And Vomiting  ? Protonix [pantoprazole] Other (See Comments)  ? Severe HA, nausea, diarrhea  ? Amoxicillin Other (See Comments)  ? Amoxicillin-pot Clavulanate   ? Severe yeast infection  ? Ciprofloxacin Nausea Only  ? Estrogens   ? Birth control patch caused severe reaction  ? ?  ? ?  ?Medication List  ?  ? ?  ?  Accurate as of September 01, 2021 11:59 PM. If you have any questions, ask your nurse or doctor.  ?  ?  ? ?  ? ?STOP taking these medications   ? ?cyclobenzaprine 10 MG tablet ?Commonly known as: FLEXERIL ?Stopped by: Claretta Fraise, MD ?  ?hyoscyamine 0.125 MG tablet ?Commonly known as: LEVSIN ?Stopped by: Claretta Fraise, MD ?  ? ?  ? ?TAKE these medications   ? ?cetirizine 10 MG tablet ?Commonly known as: ZYRTEC ?Take 1 tablet (10 mg total) by mouth daily. ?  ?OVER THE COUNTER MEDICATION ?Floragen ?  ?OVER THE COUNTER MEDICATION ?Selgevity for stress ?  ?pantoprazole 40 MG tablet ?Commonly known as: PROTONIX ?Take 40 mg by mouth daily. ?  ?rosuvastatin 10 MG tablet ?Commonly known as: CRESTOR ?Take 1 tablet (10 mg total) by mouth daily. (NEEDS TO BE SEEN BEFORE NEXT REFILL) ?  ?Vitamin D (Ergocalciferol) 1.25 MG (50000 UNIT) Caps capsule ?Commonly known as: DRISDOL ?Take 1 capsule (50,000 Units total) by mouth every 7 (seven) days. ?  ? ?  ? ? ? ?Follow-up: Return in about 6 months (around 03/03/2022). ? ?Claretta Fraise, M.D. ?

## 2021-09-06 NOTE — Progress Notes (Signed)
Patient aware.

## 2021-09-07 ENCOUNTER — Encounter: Payer: Self-pay | Admitting: Family Medicine

## 2021-09-07 ENCOUNTER — Other Ambulatory Visit: Payer: Medicaid Other

## 2021-09-07 DIAGNOSIS — E559 Vitamin D deficiency, unspecified: Secondary | ICD-10-CM | POA: Diagnosis not present

## 2021-09-07 DIAGNOSIS — R0789 Other chest pain: Secondary | ICD-10-CM | POA: Diagnosis not present

## 2021-09-07 DIAGNOSIS — Z0000 Encounter for general adult medical examination without abnormal findings: Secondary | ICD-10-CM | POA: Diagnosis not present

## 2021-09-07 DIAGNOSIS — E782 Mixed hyperlipidemia: Secondary | ICD-10-CM | POA: Diagnosis not present

## 2021-09-07 DIAGNOSIS — Q278 Other specified congenital malformations of peripheral vascular system: Secondary | ICD-10-CM | POA: Diagnosis not present

## 2021-09-07 DIAGNOSIS — M797 Fibromyalgia: Secondary | ICD-10-CM | POA: Diagnosis not present

## 2021-09-07 DIAGNOSIS — K219 Gastro-esophageal reflux disease without esophagitis: Secondary | ICD-10-CM | POA: Insufficient documentation

## 2021-09-07 LAB — URINALYSIS
Bilirubin, UA: NEGATIVE
Glucose, UA: NEGATIVE
Ketones, UA: NEGATIVE
Nitrite, UA: NEGATIVE
Protein,UA: NEGATIVE
Specific Gravity, UA: 1.01 (ref 1.005–1.030)
Urobilinogen, Ur: 0.2 mg/dL (ref 0.2–1.0)
pH, UA: 7 (ref 5.0–7.5)

## 2021-09-08 ENCOUNTER — Encounter: Payer: Self-pay | Admitting: Family Medicine

## 2021-09-08 LAB — LIPID PANEL
Chol/HDL Ratio: 6.4 ratio — ABNORMAL HIGH (ref 0.0–4.4)
Cholesterol, Total: 275 mg/dL — ABNORMAL HIGH (ref 100–199)
HDL: 43 mg/dL (ref >39)
LDL Chol Calc (NIH): 210 mg/dL — ABNORMAL HIGH (ref 0–99)
Triglycerides: 120 mg/dL (ref 0–149)
VLDL Cholesterol Cal: 22 mg/dL (ref 5–40)

## 2021-09-08 LAB — VITAMIN D 25 HYDROXY (VIT D DEFICIENCY, FRACTURES): Vit D, 25-Hydroxy: 32 ng/mL (ref 30.0–100.0)

## 2021-09-09 ENCOUNTER — Telehealth: Payer: Self-pay | Admitting: Family Medicine

## 2021-09-09 NOTE — Telephone Encounter (Signed)
GERD is getting is worse and worse = unable to eat much  ? ?Prefers [facility]  ? ? ?Dx GERD at hosp (only done CXR)  ?Wants further testing  - if [place] is not an option, is there anything else she can do  ? ? ? ?Gastro - waiting on Thoracic surgery appt  ? ?Thoracic surgery appt is sch for 10/01/21 ? ? ?

## 2021-09-10 ENCOUNTER — Other Ambulatory Visit: Payer: Self-pay | Admitting: Family Medicine

## 2021-09-10 ENCOUNTER — Encounter: Payer: Self-pay | Admitting: Family Medicine

## 2021-09-10 ENCOUNTER — Ambulatory Visit (HOSPITAL_BASED_OUTPATIENT_CLINIC_OR_DEPARTMENT_OTHER)
Admission: RE | Admit: 2021-09-10 | Discharge: 2021-09-10 | Disposition: A | Discharge status: Home / Self Care | Payer: Medicaid Other | Source: Ambulatory Visit | Attending: Family Medicine | Admitting: Family Medicine

## 2021-09-10 DIAGNOSIS — R109 Unspecified abdominal pain: Secondary | ICD-10-CM | POA: Diagnosis not present

## 2021-09-10 DIAGNOSIS — R1084 Generalized abdominal pain: Secondary | ICD-10-CM | POA: Diagnosis not present

## 2021-09-10 MED ORDER — VITAMIN D (ERGOCALCIFEROL) 1.25 MG (50000 UNIT) PO CAPS: 50000.0000 [IU] | ORAL_CAPSULE | ORAL | 3 refills | Status: AC

## 2021-09-10 MED ORDER — DICYCLOMINE HCL 10 MG PO CAPS: 10.0000 mg | ORAL_CAPSULE | Freq: Three times a day (TID) | ORAL | 5 refills | Status: DC

## 2021-09-10 NOTE — Telephone Encounter (Signed)
Patient aware and would like to be scheduled asap.  ?

## 2021-09-10 NOTE — Telephone Encounter (Signed)
I ordered the referral

## 2021-09-10 NOTE — Telephone Encounter (Signed)
Ask her to describe the symptoms that cause her to feel bad. PErhaps I can suggest an alternative medication. ?

## 2021-10-01 ENCOUNTER — Encounter: Payer: Medicaid Other | Admitting: Thoracic Surgery (Cardiothoracic Vascular Surgery)

## 2021-10-04 ENCOUNTER — Institutional Professional Consult (permissible substitution): Payer: Medicaid Other | Admitting: Thoracic Surgery (Cardiothoracic Vascular Surgery)

## 2021-10-04 ENCOUNTER — Encounter: Payer: Self-pay | Admitting: Thoracic Surgery (Cardiothoracic Vascular Surgery)

## 2021-10-04 DIAGNOSIS — Q278 Other specified congenital malformations of peripheral vascular system: Secondary | ICD-10-CM

## 2021-10-05 NOTE — Progress Notes (Signed)
? ?   ?[address] ?      [place] [address] ?            [phone]       ? ?Burman Nieves ?[facility] Medical Record #[id number] ?Date of Birth: 04/04/1986 ? ?Referring: Mechele Claude, MD ?Primary Care: Mechele Claude, MD ?Primary Cardiologist:None ? ?Chief Complaint:    ?Chief Complaint  ?Patient presents with  ? Aberrant right subclavian artery  ?  New patient consultation CTA chest 08/30/21  ? ? ?History of Present Illness:     ?36 year old female presents for surgical evaluation for retroesophageal right subclavian artery.  This was found incidentally when she presented to the emergency department with acute onset chest pain.  She denies any respiratory symptoms or any dysphagia or odynophagia.  She does have a history of reflux and this is currently being worked up. ? ? ? ? ?Past Medical History:  ?Diagnosis Date  ? Hyperlipidemia   ? ? ?Past Surgical History:  ?Procedure Laterality Date  ? CESAREAN SECTION    ? WISDOM TOOTH EXTRACTION    ? ? ? ? ?Social History  ? ?Tobacco Use  ?Smoking Status Never  ?Smokeless Tobacco Never  ?  ?Social History  ? ?Substance and Sexual Activity  ?Alcohol Use No  ? ? ? ?Allergies  ?Allergen Reactions  ? Duloxetine Hcl Nausea And Vomiting  ? Protonix [Pantoprazole] Other (See Comments)  ?  Severe HA, nausea, diarrhea ?  ? Amoxicillin Other (See Comments)  ? Amoxicillin-Pot Clavulanate   ?  Severe yeast infection  ? Ciprofloxacin Nausea Only  ? Estrogens   ?  Birth control patch caused severe reaction  ? ? ? ? ?Current Outpatient Medications  ?Medication Sig Dispense Refill  ? cetirizine (ZYRTEC) 10 MG tablet Take 1 tablet (10 mg total) by mouth daily. 30 tablet 11  ? OVER THE COUNTER MEDICATION Floragen    ? OVER THE COUNTER MEDICATION Selgevity for stress    ? rosuvastatin (CRESTOR) 10 MG tablet Take 1 tablet (10 mg total) by mouth daily. (NEEDS TO BE SEEN BEFORE NEXT REFILL) 30 tablet 0  ? Vitamin D, Ergocalciferol, (DRISDOL) 1.25 MG (50000 UNIT) CAPS  capsule Take 1 capsule (50,000 Units total) by mouth every 7 (seven) days. 13 capsule 3  ? ?No current facility-administered medications for this visit.  ? ? ?(Not in a hospital admission) ? ? ?Family History  ?Problem Relation Age of Onset  ? Hypertension Mother   ? Hyperlipidemia Mother   ? Colon cancer Mother 23  ?     Stage 3 s/p surgery and 22+ lymph nodes; no chemo  ? Hypertension Father   ? Diabetes Father   ? Hyperlipidemia Father   ? Arthritis Father   ? Gout Father   ? Lupus Sister   ? Hypertension Brother   ? Healthy Son   ? ? ? ?Review of Systems:  ? ?Review of Systems  ?Constitutional: Negative.   ?HENT:  Negative for sore throat.   ?Cardiovascular:  Positive for chest pain.  ?Gastrointestinal:  Positive for heartburn. Negative for diarrhea, nausea and vomiting.  ?  ? ?Physical Exam: ?BP 107/71 (BP Location: Left Arm, Patient Position: Sitting, Cuff Size: Normal)   Pulse 85   Resp 20   Ht 5\' 2"  (1.575 m)   Wt 189 lb (85.7 kg)   SpO2 98% Comment: RA  BMI 34.57 kg/m?  ?Physical Exam ?Constitutional:   ?   Appearance: She is obese.  She is not ill-appearing.  ?Cardiovascular:  ?   Rate and Rhythm: Normal rate.  ?Pulmonary:  ?   Effort: Pulmonary effort is normal.  ?Abdominal:  ?   General: There is no distension.  ?Musculoskeletal:  ?   Cervical back: Normal range of motion.  ?Neurological:  ?   General: No focal deficit present.  ?   Mental Status: She is alert and oriented to person, place, and time.  ?  ? ? ?   ? ?I have independently reviewed the above radiologic studies and discussed with the patient  ? ?Recent Lab Findings: ?Lab Results  ?Component Value Date  ? WBC 5.9 12/24/2020  ? HGB 14.2 12/24/2020  ? HCT 42.0 12/24/2020  ? PLT 280 12/24/2020  ? GLUCOSE 112 (H) 12/24/2020  ? CHOL 275 (H) 09/07/2021  ? TRIG 120 09/07/2021  ? HDL 43 09/07/2021  ? LDLCALC 210 (H) 09/07/2021  ? ALT 47 (H) 12/24/2020  ? AST 22 12/24/2020  ? NA 144 12/24/2020  ? K 5.1 12/24/2020  ? CL 103 12/24/2020  ? CREATININE  0.71 12/24/2020  ? BUN 11 12/24/2020  ? CO2 26 12/24/2020  ? TSH 1.530 04/13/2020  ? ? ? ? ?Assessment / Plan:   ?36 year old female with an aberrant right subclavian artery taking a retroesophageal course.  I personally reviewed her cross-sectional imaging.  Additionally she does not have any symptoms concerning for esophageal or tracheal compression.  I had a long discussion with Mrs. Knab, and her husband and reassured her that no surgical intervention is required since she is asymptomatic.  If she develops symptoms in the future then we can discuss the potential for a robotic assisted ligation of the subclavian artery at the base. ? ? ? ? ?I  spent 40 minutes counseling the patient face to face. ? ? ?Eliezer Lofts [place] ?10/05/2021 2:04 PM ? ? ? ? ? ? ?

## 2021-10-18 DIAGNOSIS — Z800 Family history of malignant neoplasm of digestive organs: Secondary | ICD-10-CM | POA: Diagnosis not present

## 2021-10-18 DIAGNOSIS — K219 Gastro-esophageal reflux disease without esophagitis: Secondary | ICD-10-CM | POA: Diagnosis not present

## 2021-10-31 ENCOUNTER — Encounter: Payer: Self-pay | Admitting: Family Medicine

## 2021-12-18 DIAGNOSIS — H6691 Otitis media, unspecified, right ear: Secondary | ICD-10-CM | POA: Diagnosis not present

## 2021-12-18 DIAGNOSIS — R070 Pain in throat: Secondary | ICD-10-CM | POA: Diagnosis not present

## 2022-06-30 ENCOUNTER — Encounter: Payer: Self-pay | Admitting: Family Medicine

## 2022-06-30 ENCOUNTER — Ambulatory Visit: Payer: 59 | Admitting: Family Medicine

## 2022-06-30 VITALS — BP 108/58 | HR 84 | Temp 97.5°F | Ht 62.0 in | Wt 201.4 lb

## 2022-06-30 DIAGNOSIS — N3090 Cystitis, unspecified without hematuria: Secondary | ICD-10-CM | POA: Diagnosis not present

## 2022-06-30 DIAGNOSIS — R300 Dysuria: Secondary | ICD-10-CM

## 2022-06-30 LAB — URINALYSIS, COMPLETE
Bilirubin, UA: NEGATIVE
Glucose, UA: NEGATIVE
Ketones, UA: NEGATIVE
Leukocytes,UA: NEGATIVE
Nitrite, UA: NEGATIVE
Protein,UA: NEGATIVE
RBC, UA: NEGATIVE
Specific Gravity, UA: 1.01 (ref 1.005–1.030)
Urobilinogen, Ur: 0.2 mg/dL (ref 0.2–1.0)
pH, UA: 7 (ref 5.0–7.5)

## 2022-06-30 LAB — MICROSCOPIC EXAMINATION
Renal Epithel, UA: NONE SEEN /hpf
WBC, UA: NONE SEEN /hpf (ref 0–5)

## 2022-06-30 MED ORDER — SULFAMETHOXAZOLE-TRIMETHOPRIM 800-160 MG PO TABS: 1.0000 | ORAL_TABLET | Freq: Two times a day (BID) | ORAL | 0 refills | Status: DC

## 2022-06-30 MED ORDER — FLUCONAZOLE 150 MG PO TABS
150.0000 mg | ORAL_TABLET | Freq: Once | ORAL | 0 refills | Status: AC
Start: 2022-06-30 — End: 2022-06-30

## 2022-06-30 NOTE — Progress Notes (Signed)
Subjective:  Patient ID: Kara Irwin, female    DOB: Jun 20, 1985  Age: 37 y.o. MRN: [id number]  CC: Dysuria and Nausea   HPI Kara Irwin presents for burning with urination and frequency for several days. Denies fever . No flank pain. No nausea, vomiting.      06/30/2022    9:29 AM 09/01/2021    1:09 PM 08/11/2021    9:21 AM  Depression screen PHQ 2/9  Decreased Interest 0 0   Down, Depressed, Hopeless 0 0   PHQ - 2 Score 0 0   Altered sleeping   0  Tired, decreased energy   1  Change in appetite   0  Feeling bad or failure about yourself    0  Trouble concentrating   0  Moving slowly or fidgety/restless   0  Suicidal thoughts   0    History Kara Irwin has a past medical history of Hyperlipidemia.   She has a past surgical history that includes Wisdom tooth extraction and Cesarean section.   Her family history includes Arthritis in her father; Colon cancer (age of onset: 68) in her mother; Diabetes in her father; Gout in her father; Healthy in her son; Hyperlipidemia in her father and mother; Hypertension in her brother, father, and mother; Lupus in her sister.She reports that she has never smoked. She has never used smokeless tobacco. She reports that she does not drink alcohol and does not use drugs.    ROS Review of Systems  Constitutional:  Negative for chills, diaphoresis and fever.  HENT:  Negative for congestion.   Eyes:  Negative for visual disturbance.  Respiratory:  Negative for cough and shortness of breath.   Cardiovascular:  Negative for chest pain and palpitations.  Gastrointestinal:  Negative for constipation, diarrhea and nausea.  Genitourinary:  Positive for dysuria, frequency and urgency. Negative for decreased urine volume, flank pain, hematuria, menstrual problem and pelvic pain.  Musculoskeletal:  Negative for arthralgias and joint swelling.  Skin:  Negative for rash.  Neurological:  Negative for dizziness and numbness.    Objective:  BP  (!) 108/58   Pulse 84   Temp (!) 97.5 F (36.4 C)   Ht '5\' 2"'$  (1.575 m)   Wt 201 lb 6.4 oz (91.4 kg)   SpO2 99%   BMI 36.84 kg/m   BP Readings from Last 3 Encounters:  06/30/22 (!) 108/58  10/04/21 107/71  09/01/21 107/67    Wt Readings from Last 3 Encounters:  06/30/22 201 lb 6.4 oz (91.4 kg)  10/04/21 189 lb (85.7 kg)  09/01/21 195 lb 6.4 oz (88.6 kg)     Physical Exam Constitutional:      Appearance: She is well-developed.  HENT:     Head: Normocephalic and atraumatic.  Cardiovascular:     Rate and Rhythm: Normal rate and regular rhythm.     Heart sounds: No murmur heard. Pulmonary:     Effort: Pulmonary effort is normal.     Breath sounds: Normal breath sounds.  Abdominal:     General: Bowel sounds are normal.     Palpations: Abdomen is soft. There is no mass.     Tenderness: There is no abdominal tenderness. There is no guarding or rebound.  Musculoskeletal:        General: No tenderness.  Skin:    General: Skin is warm and dry.  Neurological:     Mental Status: She is alert and oriented to person, place, and time.  Psychiatric:  Behavior: Behavior normal.       Assessment & Plan:   Kara Irwin was seen today for dysuria and nausea.  Diagnoses and all orders for this visit:  Dysuria -     Urinalysis, Complete -     Urine Culture  Cystitis  Other orders -     sulfamethoxazole-trimethoprim (BACTRIM DS) 800-160 MG tablet; Take 1 tablet by mouth 2 (two) times daily. -     fluconazole (DIFLUCAN) 150 MG tablet; Take 1 tablet (150 mg total) by mouth once for 1 dose. At onset of symptoms. Repeat at end of treatment       Kara Irwin start on sulfamethoxazole-trimethoprim and fluconazole. Kara am also having her maintain her OVER THE COUNTER MEDICATION, cetirizine, OVER THE COUNTER MEDICATION, rosuvastatin, and Vitamin D (Ergocalciferol).  Allergies as of 06/30/2022       Reactions   Duloxetine Hcl Nausea And Vomiting   Protonix  [pantoprazole] Other (See Comments)   Severe HA, nausea, diarrhea   Amoxicillin Other (See Comments)   Amoxicillin-pot Clavulanate    Severe yeast infection   Ciprofloxacin Nausea Only   Estrogens    Birth control patch caused severe reaction        Medication List        Accurate as of June 30, 2022 10:16 AM. If you have any questions, ask your nurse or doctor.          cetirizine 10 MG tablet Commonly known as: ZYRTEC Take 1 tablet (10 mg total) by mouth daily.   fluconazole 150 MG tablet Commonly known as: DIFLUCAN Take 1 tablet (150 mg total) by mouth once for 1 dose. At onset of symptoms. Repeat at end of treatment Started by: Claretta Fraise, MD   OVER THE COUNTER MEDICATION Floragen   OVER THE COUNTER MEDICATION Selgevity for stress   rosuvastatin 10 MG tablet Commonly known as: CRESTOR Take 1 tablet (10 mg total) by mouth daily. (NEEDS TO BE SEEN BEFORE NEXT REFILL)   sulfamethoxazole-trimethoprim 800-160 MG tablet Commonly known as: BACTRIM DS Take 1 tablet by mouth 2 (two) times daily. Started by: Claretta Fraise, MD   Vitamin D (Ergocalciferol) 1.25 MG (50000 UNIT) Caps capsule Commonly known as: DRISDOL Take 1 capsule (50,000 Units total) by mouth every 7 (seven) days.         Follow-up: Return if symptoms worsen or fail to improve.  Claretta Fraise, M.D.

## 2022-07-02 LAB — URINE CULTURE

## 2022-07-19 ENCOUNTER — Ambulatory Visit: Payer: 59 | Admitting: Family Medicine

## 2022-07-19 ENCOUNTER — Encounter: Payer: Self-pay | Admitting: Family Medicine

## 2022-07-19 VITALS — BP 125/84 | HR 88 | Temp 98.2°F | Ht 62.0 in | Wt 197.4 lb

## 2022-07-19 DIAGNOSIS — Z8742 Personal history of other diseases of the female genital tract: Secondary | ICD-10-CM | POA: Diagnosis not present

## 2022-07-19 DIAGNOSIS — L02818 Cutaneous abscess of other sites: Secondary | ICD-10-CM

## 2022-07-19 DIAGNOSIS — L0291 Cutaneous abscess, unspecified: Secondary | ICD-10-CM

## 2022-07-19 DIAGNOSIS — J0140 Acute pansinusitis, unspecified: Secondary | ICD-10-CM

## 2022-07-19 MED ORDER — FLUTICASONE PROPIONATE 50 MCG/ACT NA SUSP: 2.0000 | Freq: Every day | NASAL | 6 refills | Status: AC

## 2022-07-19 MED ORDER — DOXYCYCLINE HYCLATE 100 MG PO TABS: 100.0000 mg | ORAL_TABLET | Freq: Two times a day (BID) | ORAL | 0 refills | Status: AC

## 2022-07-19 MED ORDER — FLUCONAZOLE 150 MG PO TABS: 150.0000 mg | ORAL_TABLET | Freq: Once | ORAL | 0 refills | Status: AC

## 2022-07-19 NOTE — Progress Notes (Signed)
Subjective:  Patient ID: Kara Irwin, female    DOB: 06/28/1985, 37 y.o.   MRN: [id number]  Patient Care Team: Claretta Fraise, MD as PCP - General (Family Medicine) Eloise Harman, DO as Consulting Physician (Internal Medicine)   Chief Complaint:  nasal draniage (X 1 week. ) and Cyst (Patient states that she has been having re occurring cysts in her vaginal area for years. )   HPI: Kara Irwin is a 37 y.o. female presenting on 07/19/2022 for nasal draniage (X 1 week. ) and Cyst (Patient states that she has been having re occurring cysts in her vaginal area for years. )   Pt presents today for evaluation of rhinorrhea, pressure in head/sinuses, congestion for several weeks. Has been taking NyQuil and Claritin with minimal relief. She also reports an abscess to her vaginal area. States this started several days ago. States it has ruptured and was draining. Has occurred in the past. Has not had this evaluated before.     Relevant past medical, surgical, family, and social history reviewed and updated as indicated.  Allergies and medications reviewed and updated. Data reviewed: Chart in Epic.   Past Medical History:  Diagnosis Date   Hyperlipidemia     Past Surgical History:  Procedure Laterality Date   CESAREAN SECTION     WISDOM TOOTH EXTRACTION      Social History   Socioeconomic History   Marital status: Single    Spouse name: Not on file   Number of children: Not on file   Years of education: Not on file   Highest education level: Not on file  Occupational History   Not on file  Tobacco Use   Smoking status: Never   Smokeless tobacco: Never  Vaping Use   Vaping Use: Never used  Substance and Sexual Activity   Alcohol use: No   Drug use: No   Sexual activity: Not on file  Other Topics Concern   Not on file  Social History Narrative   Not on file   Social Determinants of Health   Financial Resource Strain: Not on file  Food Insecurity: Not  on file  Transportation Needs: Not on file  Physical Activity: Not on file  Stress: Not on file  Social Connections: Not on file  Intimate Partner Violence: Not on file    Outpatient Encounter Medications as of 07/19/2022  Medication Sig   cetirizine (ZYRTEC) 10 MG tablet Take 1 tablet (10 mg total) by mouth daily.   doxycycline (VIBRA-TABS) 100 MG tablet Take 1 tablet (100 mg total) by mouth 2 (two) times daily for 10 days. 1 po bid   fluconazole (DIFLUCAN) 150 MG tablet Take 1 tablet (150 mg total) by mouth once for 1 dose.   fluticasone (FLONASE) 50 MCG/ACT nasal spray Place 2 sprays into both nostrils daily.   OVER THE COUNTER MEDICATION Floragen   OVER THE COUNTER MEDICATION Selgevity for stress   rosuvastatin (CRESTOR) 10 MG tablet Take 1 tablet (10 mg total) by mouth daily. (NEEDS TO BE SEEN BEFORE NEXT REFILL)   Vitamin D, Ergocalciferol, (DRISDOL) 1.25 MG (50000 UNIT) CAPS capsule Take 1 capsule (50,000 Units total) by mouth every 7 (seven) days.   [DISCONTINUED] sulfamethoxazole-trimethoprim (BACTRIM DS) 800-160 MG tablet Take 1 tablet by mouth 2 (two) times daily.   No facility-administered encounter medications on file as of 07/19/2022.    Allergies  Allergen Reactions   Duloxetine Hcl Nausea And Vomiting   Protonix [Pantoprazole]  Other (See Comments)    Severe HA, nausea, diarrhea    Amoxicillin Other (See Comments)   Amoxicillin-Pot Clavulanate     Severe yeast infection   Ciprofloxacin Nausea Only   Estrogens     Birth control patch caused severe reaction    Review of Systems  Constitutional:  Negative for activity change, appetite change, chills, diaphoresis, fatigue, fever and unexpected weight change.  HENT:  Positive for congestion, postnasal drip, rhinorrhea and sinus pressure. Negative for dental problem, drooling, ear discharge, ear pain, facial swelling, hearing loss, mouth sores, nosebleeds, sinus pain, sneezing, sore throat, tinnitus, trouble swallowing  and voice change.   Eyes: Negative.  Negative for photophobia and visual disturbance.  Respiratory:  Negative for cough, chest tightness and shortness of breath.   Cardiovascular:  Negative for chest pain, palpitations and leg swelling.  Gastrointestinal:  Negative for abdominal pain, blood in stool, constipation, diarrhea, nausea and vomiting.  Endocrine: Negative.  Negative for polydipsia, polyphagia and polyuria.  Genitourinary:  Negative for decreased urine volume, difficulty urinating, dysuria, frequency and urgency.  Musculoskeletal:  Negative for arthralgias and myalgias.  Skin:  Positive for color change and wound.  Allergic/Immunologic: Negative.   Neurological:  Negative for dizziness, tremors, seizures, syncope, speech difficulty, weakness, light-headedness, numbness and headaches.  Hematological: Negative.   Psychiatric/Behavioral:  Negative for confusion, hallucinations, sleep disturbance and suicidal ideas.   All other systems reviewed and are negative.       Objective:  BP 125/84   Pulse 88   Temp 98.2 F (36.8 C) (Temporal)   Ht 5' 2"$  (1.575 m)   Wt 197 lb 6.4 oz (89.5 kg)   LMP 06/28/2022   SpO2 99%   BMI 36.10 kg/m    Wt Readings from Last 3 Encounters:  07/19/22 197 lb 6.4 oz (89.5 kg)  06/30/22 201 lb 6.4 oz (91.4 kg)  10/04/21 189 lb (85.7 kg)    Physical Exam Vitals and nursing note reviewed.  Constitutional:      General: She is not in acute distress.    Appearance: Normal appearance. She is well-developed and well-groomed. She is obese. She is not ill-appearing, toxic-appearing or diaphoretic.  HENT:     Head: Normocephalic and atraumatic.     Jaw: There is normal jaw occlusion.     Right Ear: Hearing normal. A middle ear effusion is present.     Left Ear: Hearing normal. A middle ear effusion is present.     Nose: Nose normal.     Mouth/Throat:     Lips: Pink.     Mouth: Mucous membranes are moist.     Pharynx: Oropharynx is clear. Uvula  midline.     Comments: Postnasal drainage, cobblestoning Eyes:     General: Lids are normal.     Extraocular Movements: Extraocular movements intact.     Conjunctiva/sclera: Conjunctivae normal.     Pupils: Pupils are equal, round, and reactive to light.  Neck:     Thyroid: No thyroid mass, thyromegaly or thyroid tenderness.     Vascular: No carotid bruit or JVD.     Trachea: Trachea and phonation normal.  Cardiovascular:     Rate and Rhythm: Normal rate and regular rhythm.     Chest Wall: PMI is not displaced.     Pulses: Normal pulses.     Heart sounds: Normal heart sounds. No murmur heard.    No friction rub. No gallop.  Pulmonary:     Effort: Pulmonary effort is normal. No  respiratory distress.     Breath sounds: Normal breath sounds. No wheezing.  Abdominal:     General: Bowel sounds are normal. There is no distension or abdominal bruit.     Palpations: Abdomen is soft. There is no hepatomegaly or splenomegaly.     Tenderness: There is no abdominal tenderness. There is no right CVA tenderness or left CVA tenderness.     Hernia: No hernia is present. There is no hernia in the left inguinal area or right inguinal area.  Genitourinary:    Exam position: Knee-chest position.     Pubic Area: No rash or pubic lice.      Labia:        Right: Lesion present. No rash, tenderness or injury.        Left: No rash, tenderness, lesion or injury.      Urethra: No prolapse.    Musculoskeletal:        General: Normal range of motion.     Cervical back: Normal range of motion and neck supple.     Right lower leg: No edema.     Left lower leg: No edema.  Lymphadenopathy:     Cervical: No cervical adenopathy.     Lower Body: No right inguinal adenopathy. No left inguinal adenopathy.  Skin:    General: Skin is warm and dry.     Capillary Refill: Capillary refill takes less than 2 seconds.     Coloration: Skin is not cyanotic, jaundiced or pale.     Findings: No rash.  Neurological:      General: No focal deficit present.     Mental Status: She is alert and oriented to person, place, and time.     Sensory: Sensation is intact.     Motor: Motor function is intact.     Coordination: Coordination is intact.     Gait: Gait is intact.     Deep Tendon Reflexes: Reflexes are normal and symmetric.  Psychiatric:        Attention and Perception: Attention and perception normal.        Mood and Affect: Mood and affect normal.        Speech: Speech normal.        Behavior: Behavior normal. Behavior is cooperative.        Thought Content: Thought content normal.        Cognition and Memory: Cognition and memory normal.        Judgment: Judgment normal.     Results for orders placed or performed in visit on 06/30/22  Urine Culture   Specimen: Urine   UR  Result Value Ref Range   Urine Culture, Routine Final report    Organism ID, Bacteria Comment   Microscopic Examination   Urine  Result Value Ref Range   WBC, UA None seen 0 - 5 /hpf   RBC, Urine 0-2 0 - 2 /hpf   Epithelial Cells (non renal) 0-10 0 - 10 /hpf   Renal Epithel, UA None seen None seen /hpf   Bacteria, UA Few (A) None seen/Few  Urinalysis, Complete  Result Value Ref Range   Specific Gravity, UA 1.010 1.005 - 1.030   pH, UA 7.0 5.0 - 7.5   Color, UA Yellow Yellow   Appearance Ur Clear Clear   Leukocytes,UA Negative Negative   Protein,UA Negative Negative/Trace   Glucose, UA Negative Negative   Ketones, UA Negative Negative   RBC, UA Negative Negative   Bilirubin, UA Negative Negative  Urobilinogen, Ur 0.2 0.2 - 1.0 mg/dL   Nitrite, UA Negative Negative   Microscopic Examination See below:        Pertinent labs & imaging results that were available during my care of the patient were reviewed by me and considered in my medical decision making.  Assessment & Plan:  Kara Irwin was seen today for nasal draniage and cyst.  Diagnoses and all orders for this visit:  Acute non-recurrent  pansinusitis Symptomatic care discussed in detail. Medications as prescribed. Report new, worsening, or persistent symptoms.  -     doxycycline (VIBRA-TABS) 100 MG tablet; Take 1 tablet (100 mg total) by mouth 2 (two) times daily for 10 days. 1 po bid -     fluticasone (FLONASE) 50 MCG/ACT nasal spray; Place 2 sprays into both nostrils daily.  Abscess Open and draining in office. No need for I&D. Medications as prescribed. Symptomatic care discussed in detail. Aware to report new, worsening, or persistent symptoms.  -     doxycycline (VIBRA-TABS) 100 MG tablet; Take 1 tablet (100 mg total) by mouth 2 (two) times daily for 10 days. 1 po bid  History of vaginitis -     fluconazole (DIFLUCAN) 150 MG tablet; Take 1 tablet (150 mg total) by mouth once for 1 dose.     Continue all other maintenance medications.  Follow up plan: Return if symptoms worsen or fail to improve.   Continue healthy lifestyle choices, including diet (rich in fruits, vegetables, and lean proteins, and low in salt and simple carbohydrates) and exercise (at least 30 minutes of moderate physical activity daily).  Educational handout given for skin abscess  The above assessment and management plan was discussed with the patient. The patient verbalized understanding of and has agreed to the management plan. Patient is aware to call the clinic if they develop any new symptoms or if symptoms persist or worsen. Patient is aware when to return to the clinic for a follow-up visit. Patient educated on when it is appropriate to go to the emergency department.   Monia Pouch, FNP-C [facility] [phone]

## 2022-07-25 ENCOUNTER — Encounter: Payer: Self-pay | Admitting: Family Medicine

## 2022-07-25 ENCOUNTER — Telehealth: Payer: Self-pay | Admitting: Family Medicine

## 2022-07-25 MED ORDER — PREDNISONE 10 MG PO TABS: ORAL_TABLET | ORAL | 0 refills | Status: DC

## 2022-07-25 NOTE — Telephone Encounter (Signed)
Pt say that doxycycline (VIBRA-TABS) 100 MG tablet  not working. She is still feeling bad. Pt asking for a prednisone pack. Use [facility]. Please call back

## 2022-07-25 NOTE — Telephone Encounter (Signed)
PT R/C 

## 2022-07-25 NOTE — Telephone Encounter (Signed)
Spoke to pt and she says the vaginal cyst has resolved completely but she has a lot of sinus pressure still and congestion and doesn't feel like that is getting any better. She has been taking the Doxy, flonase and mucinex but states usually she gets prednisone and it knocks it out. Advised pt you would be back in office tomorrow and that she may ntbs and re-evaluated but pt wanted me to send a message and ask if prednisone could be sent in?

## 2022-07-26 ENCOUNTER — Telehealth: Payer: 59 | Admitting: Family

## 2022-08-28 ENCOUNTER — Other Ambulatory Visit: Payer: Self-pay | Admitting: Family Medicine

## 2022-09-23 IMAGING — US US ABDOMEN COMPLETE
1 series · 14 of 25 positions shown · non-contrast
Comparison: Abdominal ultrasound 10/06/2014

CLINICAL DATA: Abdominal pain

EXAM:
ABDOMEN ULTRASOUND COMPLETE

[Series 1: us abdomen complete · 14 of 134 slices shown]
[im 1/134]
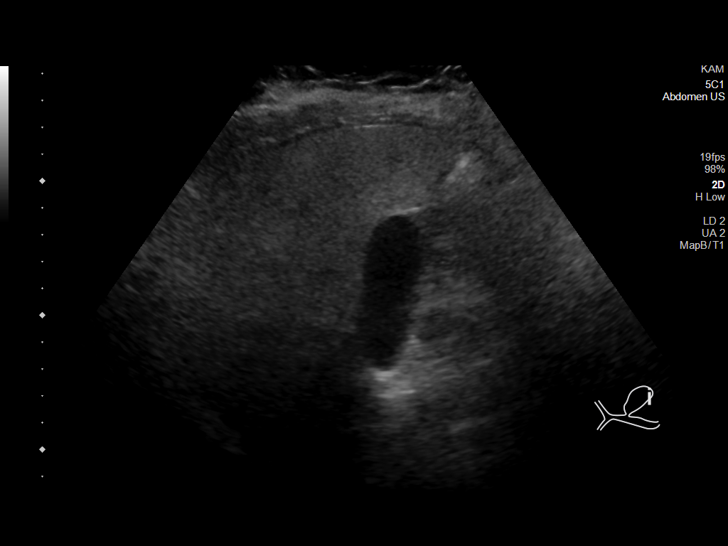
[im 12/134]
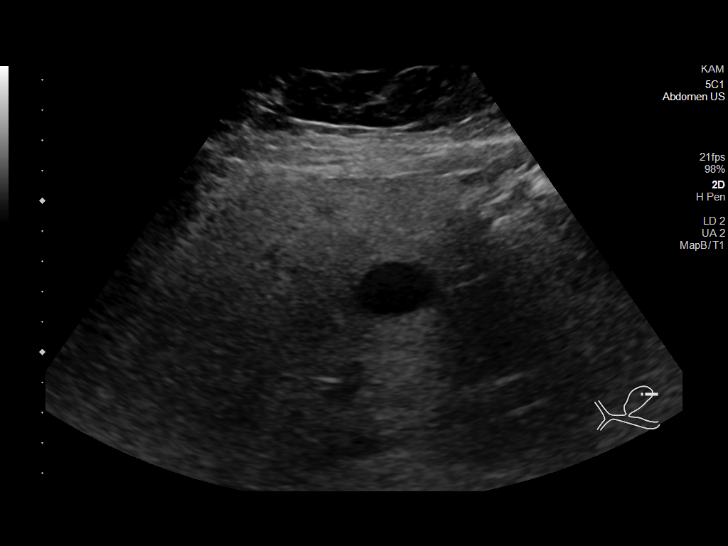
[im 23/134]
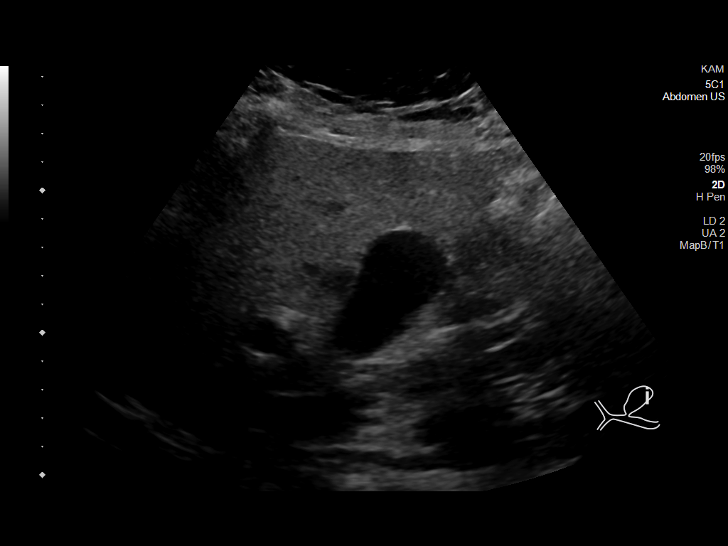
[im 34/134]
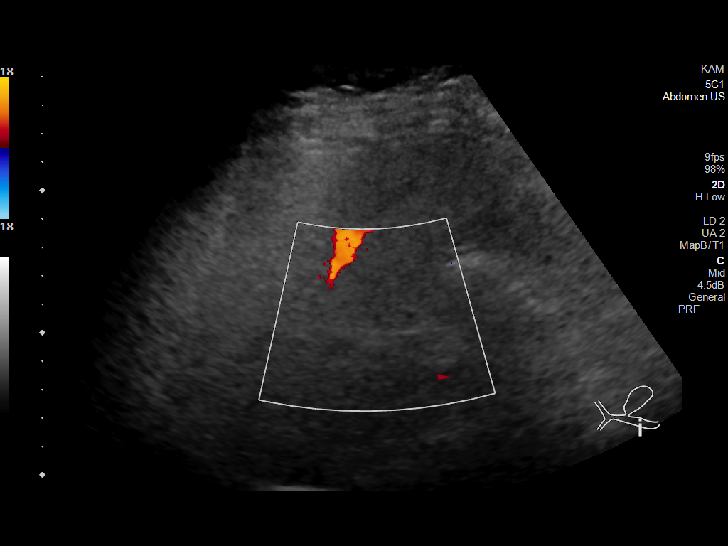
[im 45/134]
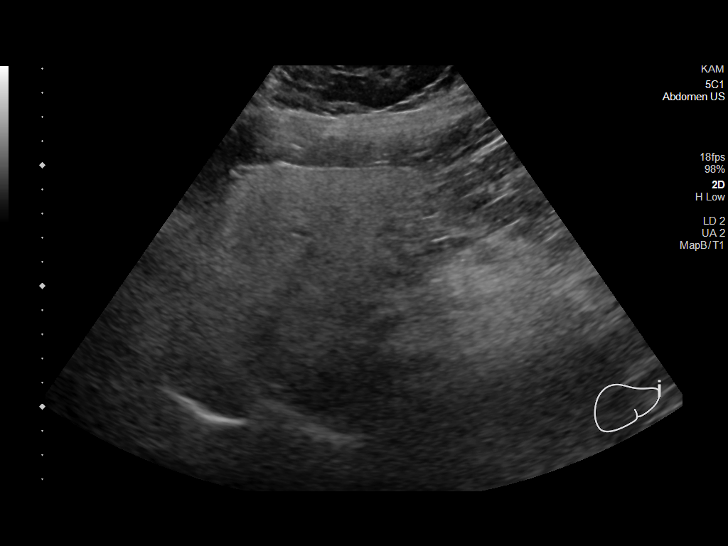
[im 50/134]
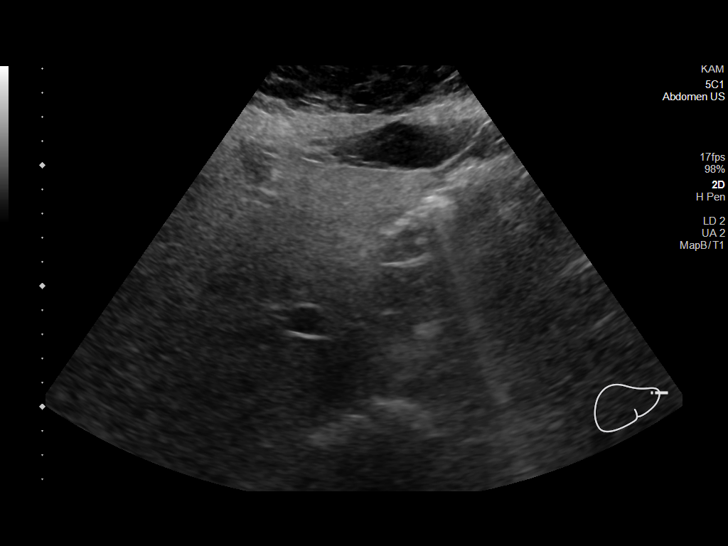
[im 61/134]
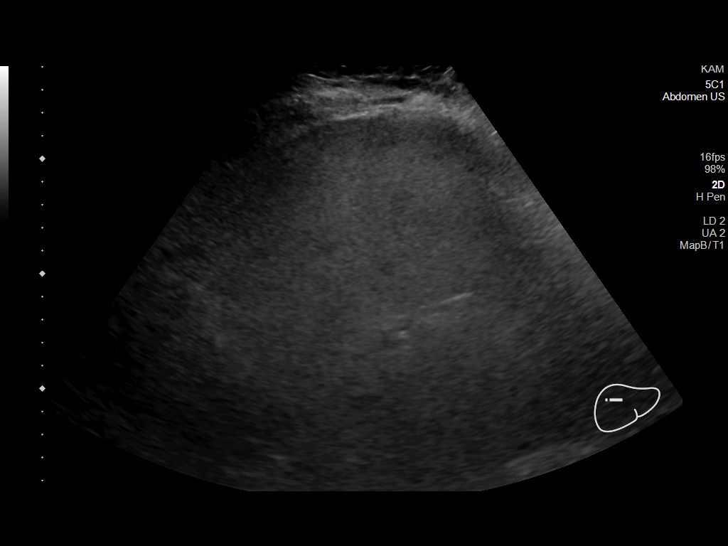
[im 73/134]
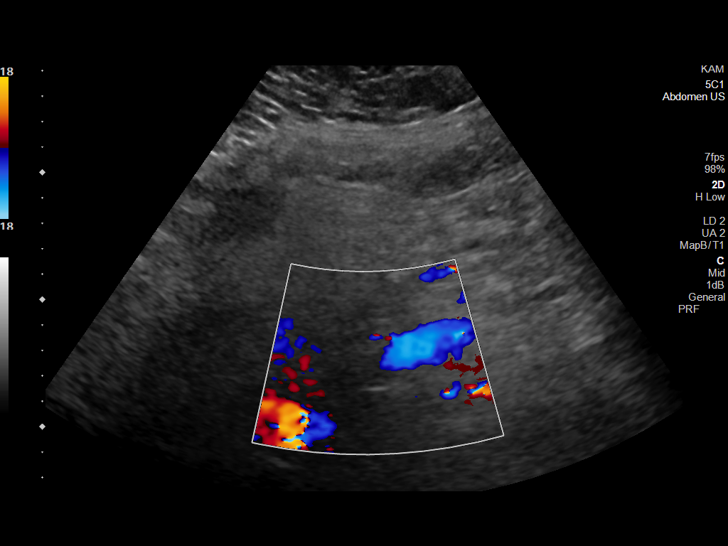
[im 84/134]
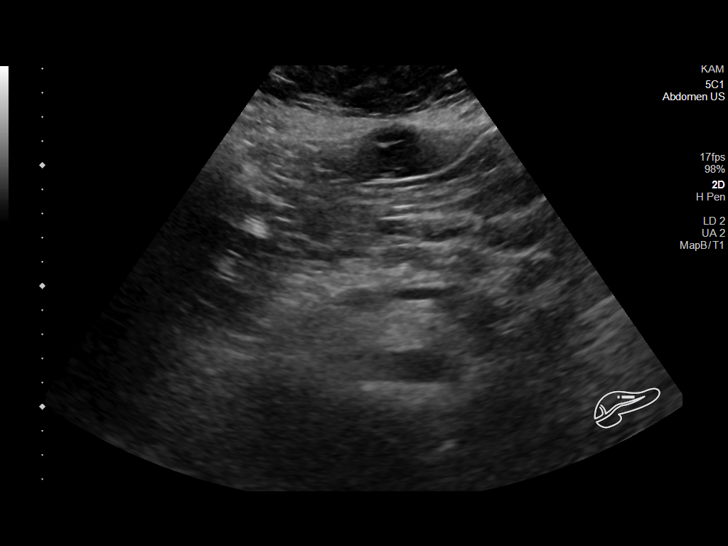
[im 89/134]
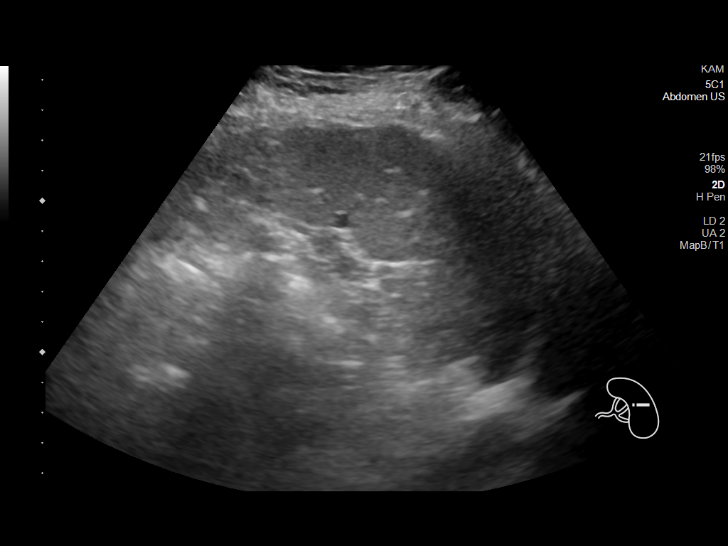
[im 100/134]
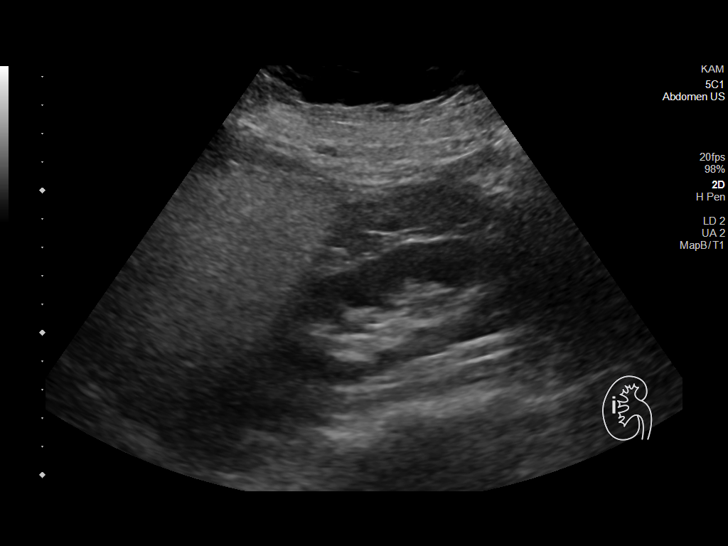
[im 111/134]
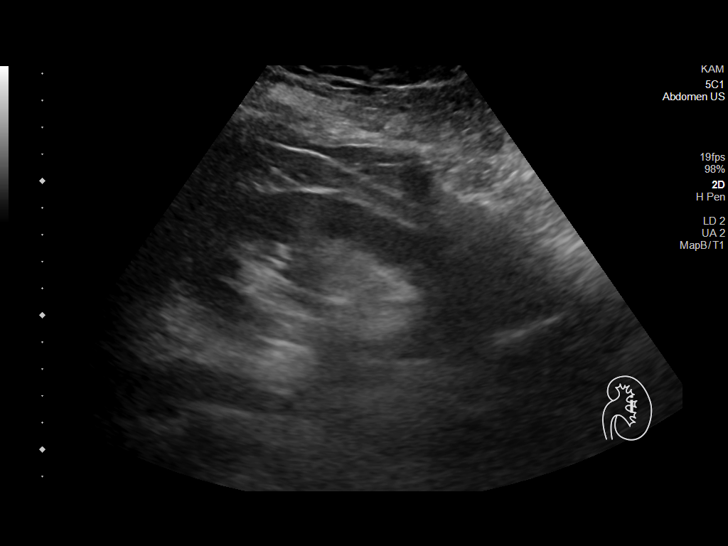
[im 122/134]
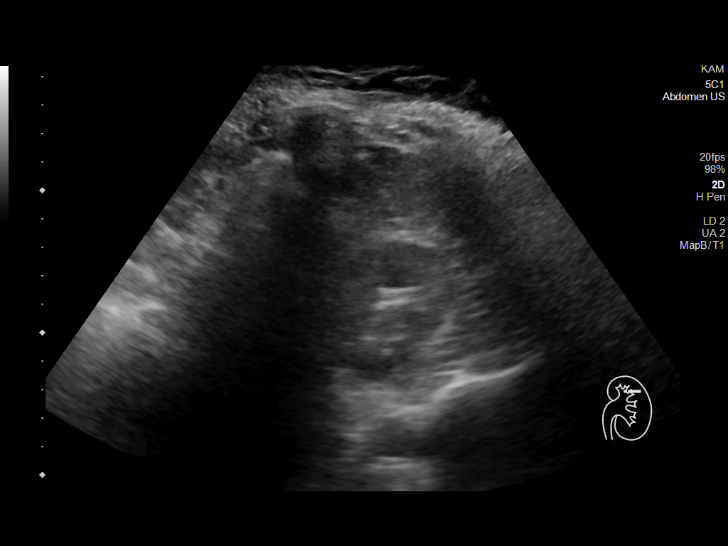
[im 134/134]
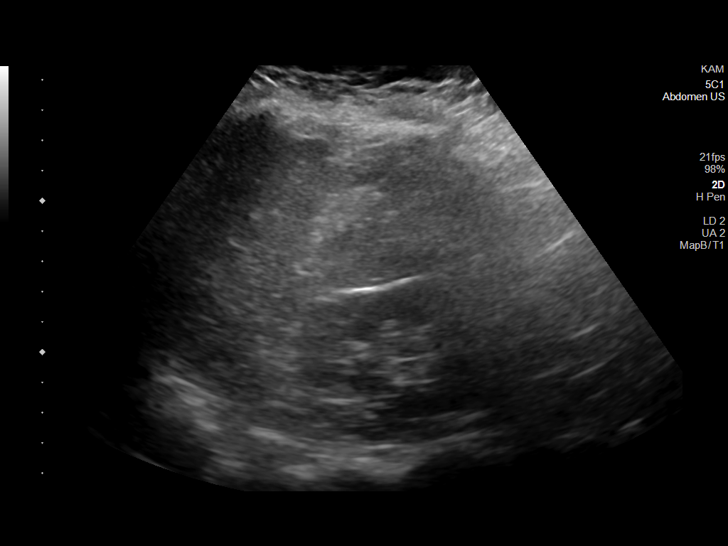

[14 of 25 positions shown; findings below may reference images not displayed]

FINDINGS: Study is limited due to body habitus and bowel gas.

Gallbladder: No gallstones or wall thickening visualized. No
sonographic Murphy sign noted by sonographer.

Common bile duct: Diameter: 2 mm

Liver: Coarse, increased echogenicity of the liver parenchyma with
no focal mass identified. Portal vein is patent on color Doppler
imaging with normal direction of blood flow towards the liver.

IVC: Not well visualized.

Pancreas: Not well visualized.  No ductal dilatation appreciated.

Spleen: Size and appearance within normal limits.

Right Kidney: Length: 11.9. Echogenicity within normal limits. No
mass or hydronephrosis visualized.

Left Kidney: Length: 11.8. Echogenicity within normal limits. No
mass or hydronephrosis visualized.

Abdominal aorta: Limited visualization.  No aneurysm visualized.

Other findings: None.
IMPRESSION: No acute process visualized. Coarse, increased echogenicity of the
liver parenchyma likely representing hepatic steatosis.

## 2022-10-13 ENCOUNTER — Encounter: Payer: Self-pay | Admitting: Family Medicine

## 2022-10-13 ENCOUNTER — Ambulatory Visit: Payer: 59 | Admitting: Family Medicine

## 2022-10-13 VITALS — BP 104/74 | HR 77 | Temp 97.8°F | Ht 62.0 in | Wt 203.4 lb

## 2022-10-13 DIAGNOSIS — N939 Abnormal uterine and vaginal bleeding, unspecified: Secondary | ICD-10-CM | POA: Diagnosis not present

## 2022-10-13 DIAGNOSIS — N760 Acute vaginitis: Secondary | ICD-10-CM

## 2022-10-13 LAB — URINALYSIS, ROUTINE W REFLEX MICROSCOPIC
Bilirubin, UA: NEGATIVE
Glucose, UA: NEGATIVE
Ketones, UA: NEGATIVE
Leukocytes,UA: NEGATIVE
Nitrite, UA: NEGATIVE
Protein,UA: NEGATIVE
Specific Gravity, UA: 1.015 (ref 1.005–1.030)
Urobilinogen, Ur: 0.2 mg/dL (ref 0.2–1.0)
pH, UA: 7 (ref 5.0–7.5)

## 2022-10-13 LAB — WET PREP FOR TRICH, YEAST, CLUE
Clue Cell Exam: NEGATIVE
Trichomonas Exam: NEGATIVE
Yeast Exam: POSITIVE — AB

## 2022-10-13 LAB — MICROSCOPIC EXAMINATION
Bacteria, UA: NONE SEEN
RBC, Urine: NONE SEEN /hpf (ref 0–2)
Renal Epithel, UA: NONE SEEN /hpf
WBC, UA: NONE SEEN /hpf (ref 0–5)

## 2022-10-13 LAB — PREGNANCY, URINE: Preg Test, Ur: NEGATIVE

## 2022-10-13 MED ORDER — FLUCONAZOLE 150 MG PO TABS: ORAL_TABLET | ORAL | 0 refills | Status: DC

## 2022-10-13 NOTE — Progress Notes (Signed)
Acute Office Visit  Subjective:     Patient ID: Kara Irwin, female    DOB: [birth year], 37 y.o.   MRN: [id number]  Chief Complaint  Patient presents with   Dysmenorrhea   Vaginitis    Vaginal Bleeding The patient's primary symptoms include genital itching (last few days) and vaginal bleeding. The patient's pertinent negatives include no genital lesions, genital odor, genital rash, missed menses or pelvic pain. This is a new (3 weeks) problem. The problem occurs daily (spotting). The problem has been unchanged. The patient is experiencing no pain. Associated symptoms include dysuria (last few days) and painful intercourse (over last few days when dysuria and itching started). Pertinent negatives include no abdominal pain, anorexia, back pain, chills, constipation, diarrhea, discolored urine, fever, flank pain, frequency, headaches, hematuria, joint pain, joint swelling, nausea, rash, sore throat, urgency or vomiting. The vaginal discharge was bloody. The vaginal bleeding is spotting. She has not been passing clots. She has not been passing tissue. She has tried nothing for the symptoms. The treatment provided no relief. She is sexually active. No, her partner does not have an STD. She uses nothing for contraception. Her menstrual history has been regular. Her past medical history is significant for a Cesarean section. There is no history of an ectopic pregnancy, endometriosis, herpes simplex, menorrhagia, miscarriage, ovarian cysts, PID or an STD.   Transvaginal US was WNL in 2023. Last PAP was 3 years ago. Denies dizziness or lightheadedness.   Review of Systems  Constitutional:  Negative for chills and fever.  HENT:  Negative for sore throat.   Gastrointestinal:  Negative for abdominal pain, anorexia, constipation, diarrhea, nausea and vomiting.  Genitourinary:  Positive for dysuria (last few days) and vaginal bleeding. Negative for flank pain, frequency, hematuria, menorrhagia, missed  menses, pelvic pain and urgency.  Musculoskeletal:  Negative for back pain and joint pain.  Skin:  Negative for rash.  Neurological:  Negative for headaches.        Objective:    BP 104/74   Pulse 77   Temp 97.8 F (36.6 C) (Temporal)   Ht 5\' 2"  (1.575 m)   Wt 203 lb 6 oz (92.3 kg)   SpO2 99%   BMI 37.20 kg/m    Physical Exam Vitals and nursing note reviewed. Exam conducted with a chaperone present.  Constitutional:      General: She is not in acute distress.    Appearance: She is not ill-appearing, toxic-appearing or diaphoretic.  Pulmonary:     Effort: Pulmonary effort is normal.  Abdominal:     General: Bowel sounds are normal. There is no distension.     Palpations: Abdomen is soft.  Genitourinary:    General: Normal vulva.     Exam position: Lithotomy position.     Vagina: No signs of injury and foreign body. Vaginal discharge (bloodly) present. No erythema, tenderness, lesions or prolapsed vaginal walls.     Cervix: Normal.     Uterus: Normal.      Adnexa: Right adnexa normal and left adnexa normal.  Musculoskeletal:     Right lower leg: No edema.     Left lower leg: No edema.  Skin:    General: Skin is warm and dry.  Neurological:     General: No focal deficit present.     Mental Status: She is alert and oriented to person, place, and time.  Psychiatric:        Mood and Affect: Mood normal.  Behavior: Behavior normal.     No results found for any visits on 10/13/22.      Assessment & Plan:    Kara Irwin was seen today for dysmenorrhea and vaginitis.  Diagnoses and all orders for this visit:  Vaginal spotting -     Pregnancy, urine -     Urinalysis, Routine w reflex microscopic -     NuSwab Vaginitis Plus (VG+)  Acute vaginitis -     WET PREP FOR TRICH, YEAST, CLUE -     fluconazole (DIFLUCAN) 150 MG tablet; Take one tablet by mouth now. Repeat in 3 days if symptoms persist. -     NuSwab Vaginitis Plus (VG+)  Unsure of etiology of  vaginal bleedings. Neg urine pregnancy. Wet prep + for yeast. However itching and dysuria started in the last few days, whereas vaginal spotting has been ongoing for 3 weeks. Pelvic exam unremarkable outside of bloody discharge. NuSwab pending. Will notify patient of results when available. Discussed referral to GYN if symptoms persist. Strict return precautions given.     Gabriel Earing, FNP

## 2022-10-16 ENCOUNTER — Encounter: Payer: Self-pay | Admitting: Family Medicine

## 2022-10-16 DIAGNOSIS — B9689 Other specified bacterial agents as the cause of diseases classified elsewhere: Secondary | ICD-10-CM

## 2022-10-16 LAB — NUSWAB VAGINITIS PLUS (VG+)
Candida albicans, NAA: POSITIVE — AB
Candida glabrata, NAA: NEGATIVE
Chlamydia trachomatis, NAA: NEGATIVE
Megasphaera 1: HIGH Score — AB
Neisseria gonorrhoeae, NAA: NEGATIVE
Trich vag by NAA: NEGATIVE

## 2022-10-17 ENCOUNTER — Other Ambulatory Visit: Payer: Self-pay | Admitting: Family Medicine

## 2022-10-17 ENCOUNTER — Telehealth: Payer: 59 | Admitting: Physician Assistant

## 2022-10-17 ENCOUNTER — Telehealth: Payer: Self-pay | Admitting: Family Medicine

## 2022-10-17 DIAGNOSIS — N760 Acute vaginitis: Secondary | ICD-10-CM | POA: Diagnosis not present

## 2022-10-17 DIAGNOSIS — B9689 Other specified bacterial agents as the cause of diseases classified elsewhere: Secondary | ICD-10-CM

## 2022-10-17 MED ORDER — METRONIDAZOLE 500 MG PO TABS: 500.0000 mg | ORAL_TABLET | Freq: Two times a day (BID) | ORAL | 0 refills | Status: DC

## 2022-10-17 MED ORDER — METRONIDAZOLE 1 % EX GEL
Freq: Every day | CUTANEOUS | 0 refills | Status: AC
Start: 2022-10-17 — End: 2022-10-24

## 2022-10-17 MED ORDER — CLINDAMYCIN HCL 300 MG PO CAPS
300.0000 mg | ORAL_CAPSULE | Freq: Two times a day (BID) | ORAL | 0 refills | Status: DC
Start: 2022-10-17 — End: 2022-11-21

## 2022-10-17 NOTE — Progress Notes (Signed)
Virtual Visit Consent   Kara Irwin, you are scheduled for a virtual visit with a [facility] provider today. Just as with appointments in the office, your consent must be obtained to participate. Your consent will be active for this visit and any virtual visit you may have with one of our providers in the next 365 days. If you have a MyChart account, a copy of this consent can be sent to you electronically.  As this is a virtual visit, video technology does not allow for your provider to perform a traditional examination. This may limit your provider's ability to fully assess your condition. If your provider identifies any concerns that need to be evaluated in person or the need to arrange testing (such as labs, EKG, etc.), we will make arrangements to do so. Although advances in technology are sophisticated, we cannot ensure that it will always work on either your end or our end. If the connection with a video visit is poor, the visit may have to be switched to a telephone visit. With either a video or telephone visit, we are not always able to ensure that we have a secure connection.  By engaging in this virtual visit, you consent to the provision of healthcare and authorize for your insurance to be billed (if applicable) for the services provided during this visit. Depending on your insurance coverage, you may receive a charge related to this service.  I need to obtain your verbal consent now. Are you willing to proceed with your visit today? Kara Irwin has provided verbal consent on 10/17/2022 for a virtual visit (video or telephone). Kara Loveless, PA-C  Date: 10/17/2022 4:16 PM  Virtual Visit via Video Note   I, Kara Irwin, connected with  Kara Irwin  ([id number], [birth year]) on 10/17/22 at  4:15 PM EDT by a video-enabled telemedicine application and verified that I am speaking with the correct person using two identifiers.  Location: Patient: Virtual Visit  Location Patient: Home Provider: Virtual Visit Location Provider: Home Office   I discussed the limitations of evaluation and management by telemedicine and the availability of in person appointments. The patient expressed understanding and agreed to proceed.    History of Present Illness: Kara Irwin is a 37 y.o. who identifies as a female who was assigned female at birth, and is being seen today for vaginal discharge. Seen 10/13/22 in office with PCP and found to have BV and Yeast. Treated with fluconazole already. Had Metronidazole for BV prescribed today, but does not wish to take due to side effects and that she has never taken it. Had been advised she could use metrogel, but she declined this as well from her PCP due to history of intolerance to vaginal creams and gels. Had been advised she would need an appt to have Clindamycin sent in. She is requesting Clindamycin instead of Metronidazole for the current BV symptoms since she has taken previously and done well with it.  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   Gastroesophageal reflux disease 09/07/2021   Seasonal allergies 05/19/2021   Vitamin D deficiency 05/19/2021   Sorethroat 11/02/2020   Thrush, oral 11/02/2020   Abdominal spasms 07/30/2020   Family history of colon cancer 07/30/2020   Rubella non-immune status, antepartum 05/16/2017   History of infertility 05/15/2017   Fibromyalgia 09/03/2016   Cyst of tonsil 11/10/2015   Acute recurrent tonsillitis 11/10/2015  SOB (shortness of breath) 05/08/2014   Chest pain 05/08/2014   Neck pain 02/10/2014   Low back pain 02/10/2014   Myalgia 02/10/2014   Arthralgia 02/10/2014   Hyperlipidemia 12/10/2013    Allergies:  Allergies  Allergen Reactions   Duloxetine Hcl Nausea And Vomiting   Protonix [Pantoprazole] Other (See Comments)    Severe HA, nausea, diarrhea    Amoxicillin Other (See Comments)    Amoxicillin-Pot Clavulanate     Severe yeast infection   Ciprofloxacin Nausea Only   Estrogens     Birth control patch caused severe reaction   Medications:  Current Outpatient Medications:    cetirizine (ZYRTEC) 10 MG tablet, Take 1 tablet (10 mg total) by mouth daily., Disp: 30 tablet, Rfl: 11   clindamycin (CLEOCIN) 300 MG capsule, Take 1 capsule (300 mg total) by mouth in the morning and at bedtime., Disp: 14 capsule, Rfl: 0   fluconazole (DIFLUCAN) 150 MG tablet, Take one tablet by mouth now. Repeat in 3 days if symptoms persist., Disp: 2 tablet, Rfl: 0   fluticasone (FLONASE) 50 MCG/ACT nasal spray, Place 2 sprays into both nostrils daily., Disp: 16 g, Rfl: 6   metroNIDAZOLE (METROGEL) 1 % gel, Apply topically daily for 7 days., Disp: 45 g, Rfl: 0   OVER THE COUNTER MEDICATION, Floragen, Disp: , Rfl:    OVER THE COUNTER MEDICATION, Selgevity for stress, Disp: , Rfl:    rosuvastatin (CRESTOR) 10 MG tablet, Take 1 tablet (10 mg total) by mouth daily. (NEEDS TO BE SEEN BEFORE NEXT REFILL), Disp: 30 tablet, Rfl: 0  Observations/Objective: Patient is well-developed, well-nourished in no acute distress.  Resting comfortably at home.  Head is normocephalic, atraumatic.  No labored breathing.  Speech is clear and coherent with logical content.  Patient is alert and oriented at baseline.    Assessment and Plan: 1. BV (bacterial vaginosis) - clindamycin (CLEOCIN) 300 MG capsule; Take 1 capsule (300 mg total) by mouth in the morning and at bedtime.  Dispense: 14 capsule; Refill: 0  - Symptoms consistent with BV; test results in MyChart - Clindamycin prescribed - Limit bubble baths, scented lotions/soaps/detergents - Limit tight fitting clothing - Seek on person evaluation if not improving or if symptoms worsen   Follow Up Instructions: I discussed the assessment and treatment plan with the patient. The patient was provided an opportunity to ask questions and all were answered. The  patient agreed with the plan and demonstrated an understanding of the instructions.  A copy of instructions were sent to the patient via MyChart unless otherwise noted below.    The patient was advised to call back or seek an in-person evaluation if the symptoms worsen or if the condition fails to improve as anticipated.  Time:  I spent 8 minutes with the patient via telehealth technology discussing the above problems/concerns.    Kara Loveless, PA-C

## 2022-10-17 NOTE — Progress Notes (Signed)
Sent treatment for BV for the patient

## 2022-10-17 NOTE — Telephone Encounter (Signed)
Pt called office, very upset that provider is refusing to send her in a Rx for the Clindamycin without an appt. She says she has already been seen for her issue and shouldn't need to be seen again. Pt requested to speak to the office manager about this so I routed her call to Georganna Skeans.

## 2022-10-17 NOTE — Telephone Encounter (Signed)
Patient has been called in an antibiotic she is unable to take.  She has sent MyChart messages as well to this affect.  She can only take Bactrim BS or Keflex.  Please let her know what to do as she really wants to get started on something soon for BV.

## 2022-10-17 NOTE — Telephone Encounter (Signed)
Pt calling and upset with [facility]'s medical advise and what he has sent in to pharmacy today. Pt says that she wants to talk to another doctor that will listen to her. Please call back

## 2022-10-17 NOTE — Patient Instructions (Signed)
Kara Irwin, thank you for joining Margaretann Loveless, PA-C for today's virtual visit.  While this provider is not your primary care provider (PCP), if your PCP is located in our provider database this encounter information will be shared with them immediately following your visit.   A [facility] MyChart account gives you access to today's visit and all your visits, tests, and labs performed at [facility] " click here if you don't have a [facility] MyChart account or go to [email or web address]  Consent: (Patient) Kara Irwin provided verbal consent for this virtual visit at the beginning of the encounter.  Current Medications:  Current Outpatient Medications:    cetirizine (ZYRTEC) 10 MG tablet, Take 1 tablet (10 mg total) by mouth daily., Disp: 30 tablet, Rfl: 11   clindamycin (CLEOCIN) 300 MG capsule, Take 1 capsule (300 mg total) by mouth in the morning and at bedtime., Disp: 14 capsule, Rfl: 0   fluconazole (DIFLUCAN) 150 MG tablet, Take one tablet by mouth now. Repeat in 3 days if symptoms persist., Disp: 2 tablet, Rfl: 0   fluticasone (FLONASE) 50 MCG/ACT nasal spray, Place 2 sprays into both nostrils daily., Disp: 16 g, Rfl: 6   metroNIDAZOLE (METROGEL) 1 % gel, Apply topically daily for 7 days., Disp: 45 g, Rfl: 0   OVER THE COUNTER MEDICATION, Floragen, Disp: , Rfl:    OVER THE COUNTER MEDICATION, Selgevity for stress, Disp: , Rfl:    rosuvastatin (CRESTOR) 10 MG tablet, Take 1 tablet (10 mg total) by mouth daily. (NEEDS TO BE SEEN BEFORE NEXT REFILL), Disp: 30 tablet, Rfl: 0   Medications ordered in this encounter:  Meds ordered this encounter  Medications   clindamycin (CLEOCIN) 300 MG capsule    Sig: Take 1 capsule (300 mg total) by mouth in the morning and at bedtime.    Dispense:  14 capsule    Refill:  0    Order Specific Question:   Supervising Provider    Answer:   Merrilee Jansky [id number]     *If you need refills on other  medications prior to your next appointment, please contact your pharmacy*  Follow-Up: Call back or seek an in-person evaluation if the symptoms worsen or if the condition fails to improve as anticipated.  [facility] [phone]  Other Instructions Bacterial Vaginosis  Bacterial vaginosis is an infection that occurs when the normal balance of bacteria in the vagina changes. This change is caused by an overgrowth of certain bacteria in the vagina. Bacterial vaginosis is the most common vaginal infection among females aged 32 to 27 years. This condition increases the risk of sexually transmitted infections (STIs). Treatment can help reduce this risk. Treatment is very important for pregnant women because this condition can cause babies to be born early (prematurely) or at a low birth weight. What are the causes? This condition is caused by an increase in harmful bacteria that are normally present in small amounts in the vagina. However, the exact reason this condition develops is not known. You cannot get bacterial vaginosis from toilet seats, bedding, swimming pools, or contact with objects around you. What increases the risk? The following factors may make you more likely to develop this condition: Having a new sexual partner or multiple sexual partners, or having unprotected sex. Douching. Having an intrauterine device (IUD). Smoking. Abusing drugs and alcohol. This may lead to riskier sexual behavior. Taking certain antibiotic medicines. Being pregnant. What are the signs or symptoms? Some  women with this condition have no symptoms. Symptoms may include: Wallace Cullens or white vaginal discharge. The discharge can be watery or foamy. A fish-like odor with discharge, especially after sex or during menstruation. Itching in and around the vagina. Burning or pain with urination. How is this diagnosed? This condition is diagnosed based on: Your medical history. A physical exam of  the vagina. Checking a sample of vaginal fluid for harmful bacteria or abnormal cells. How is this treated? This condition is treated with antibiotic medicines. These may be given as a pill, a vaginal cream, or a medicine that is put into the vagina (suppository). If the condition comes back after treatment, a second round of antibiotics may be needed. Follow these instructions at home: Medicines Take or apply over-the-counter and prescription medicines only as told by your health care provider. Take or apply your antibiotic medicine as told by your health care provider. Do not stop using the antibiotic even if you start to feel better. General instructions If you have a female sexual partner, tell her that you have a vaginal infection. She should follow up with her health care provider. If you have a female sexual partner, he does not need treatment. Avoid sexual activity until you finish treatment. Drink enough fluid to keep your urine pale yellow. Keep the area around your vagina and rectum clean. Wash the area daily with warm water. Wipe yourself from front to back after using the toilet. If you are breastfeeding, talk to your health care provider about continuing breastfeeding during treatment. Keep all follow-up visits. This is important. How is this prevented? Self-care Do not douche. Wash the outside of your vagina with warm water only. Wear cotton or cotton-lined underwear. Avoid wearing tight pants and pantyhose, especially during the summer. Safe sex Use protection when having sex. This includes: Using condoms. Using dental dams. This is a thin layer of a material made of latex or polyurethane that protects the mouth during oral sex. Limit the number of sexual partners. To help prevent bacterial vaginosis, it is best to have sex with just one partner (monogamous relationship). Make sure you and your sexual partner are tested for STIs. Drugs and alcohol Do not use any products  that contain nicotine or tobacco. These products include cigarettes, chewing tobacco, and vaping devices, such as e-cigarettes. If you need help quitting, ask your health care provider. Do not use drugs. Do not drink alcohol if: Your health care provider tells you not to do this. You are pregnant, may be pregnant, or are planning to become pregnant. If you drink alcohol: Limit how much you have to 0-1 drink a day. Be aware of how much alcohol is in your drink. In the [place] one drink equals one 12 oz bottle of beer (355 mL), one 5 oz glass of wine (148 mL), or one 1 oz glass of hard liquor (44 mL). Where to find more information Centers for Disease Control and Prevention: FootballExhibition.com.br American Sexual Health Association (ASHA): www.ashastd.org U.S. Department of Health and Health and safety inspector, Office on Women's Health: http://hoffman.com/ Contact a health care provider if: Your symptoms do not improve, even after treatment. You have more discharge or pain when urinating. You have a fever or chills. You have pain in your abdomen or pelvis. You have pain during sex. You have vaginal bleeding between menstrual periods. Summary Bacterial vaginosis is a vaginal infection that occurs when the normal balance of bacteria in the vagina changes. It results from an overgrowth of certain bacteria.  This condition increases the risk of sexually transmitted infections (STIs). Getting treated can help reduce this risk. Treatment is very important for pregnant women because this condition can cause babies to be born early (prematurely) or at low birth weight. This condition is treated with antibiotic medicines. These may be given as a pill, a vaginal cream, or a medicine that is put into the vagina (suppository). This information is not intended to replace advice given to you by your health care provider. Make sure you discuss any questions you have with your health care provider. Document Revised: 11/14/2019  Document Reviewed: 11/14/2019 Elsevier Patient Education  2023 Elsevier Inc.    If you have been instructed to have an in-person evaluation today at a local Urgent Care facility, please use the link below. It will take you to a list of all of our available [facility], including address, phone number and hours of operation. Please do not delay care.  [facility]  If you or a family member do not have a primary care provider, use the link below to schedule a visit and establish care. When you choose a [facility] primary care physician or advanced practice provider, you gain a long-term partner in health. Find a Primary Care Provider  Learn more about [facility] in-office and virtual care options: [facility] - Get Care Now

## 2022-10-18 ENCOUNTER — Telehealth (INDEPENDENT_AMBULATORY_CARE_PROVIDER_SITE_OTHER): Payer: 59 | Admitting: Family Medicine

## 2022-10-18 ENCOUNTER — Telehealth: Payer: 59

## 2022-10-18 DIAGNOSIS — Z91199 Patient's noncompliance with other medical treatment and regimen due to unspecified reason: Secondary | ICD-10-CM

## 2022-10-18 NOTE — Progress Notes (Signed)
Patient apparently seen virtually yesterday but did not cancel today's appt.  NO further attempts made to reach patient.  Start time: link sent at 12:47pm

## 2022-10-19 NOTE — Telephone Encounter (Signed)
There is a greater interaction between clindamycin and fluconazole then there is between metronidazole and fluconazole.  The interaction between metronidazole and fluconazole is a grade B interaction and the clindamycin and fluconazole is a grade C interaction which is worse.  Bactrim and Keflex do not cover BV

## 2022-10-19 NOTE — Telephone Encounter (Signed)
Advised pt of provider feedback and Pt states she went to see a different doctor and they gave her the Clindamycin and she has been taking it for 2 days.

## 2022-11-16 ENCOUNTER — Encounter: Payer: Self-pay | Admitting: Family Medicine

## 2022-11-20 ENCOUNTER — Encounter: Payer: Self-pay | Admitting: Family Medicine

## 2022-11-21 ENCOUNTER — Ambulatory Visit (INDEPENDENT_AMBULATORY_CARE_PROVIDER_SITE_OTHER): Payer: 59 | Admitting: Family Medicine

## 2022-11-21 ENCOUNTER — Encounter: Payer: Self-pay | Admitting: Family Medicine

## 2022-11-21 VITALS — BP 112/69 | HR 97 | Temp 98.2°F | Ht 62.0 in | Wt 205.6 lb

## 2022-11-21 DIAGNOSIS — N760 Acute vaginitis: Secondary | ICD-10-CM | POA: Diagnosis not present

## 2022-11-21 LAB — WET PREP FOR TRICH, YEAST, CLUE
Clue Cell Exam: NEGATIVE
Trichomonas Exam: NEGATIVE
Yeast Exam: NEGATIVE

## 2022-11-21 MED ORDER — METRONIDAZOLE 500 MG PO TABS
500.0000 mg | ORAL_TABLET | Freq: Two times a day (BID) | ORAL | 0 refills | Status: DC
Start: 2022-11-21 — End: 2022-11-24

## 2022-11-21 NOTE — Progress Notes (Signed)
Subjective:  Patient ID: Kara Irwin, female    DOB: 08/20/1985  Age: 37 y.o. MRN: [id number]  CC: Vaginitis   HPI Kara Irwin presents for itching and burning vaginallywithout discharge for abou a week. Had similar symptoms about 6 weeks ago. Treated with clindamycin. Helped for about a month. She tried boric acid X 2 at home. Yesterday shetook a 150 mg fluconazole. No relief so far.      11/21/2022   11:11 AM 11/21/2022   11:00 AM 10/13/2022    8:28 AM  Depression screen PHQ 2/9  Decreased Interest 1 0 0  Down, Depressed, Hopeless 0 0 0  PHQ - 2 Score 1 0 0  Altered sleeping 0  0  Tired, decreased energy 3  1  Change in appetite 0  0  Feeling bad or failure about yourself  0  0  Trouble concentrating 0  0  Moving slowly or fidgety/restless 0  0  Suicidal thoughts 0  0  PHQ-9 Score 4  1  Difficult doing work/chores Not difficult at all  Not difficult at all    History Kara Irwin has a past medical history of Hyperlipidemia.   She has a past surgical history that includes Wisdom tooth extraction and Cesarean section.   Her family history includes Arthritis in her father; Colon cancer (age of onset: 69) in her mother; Diabetes in her father; Gout in her father; Healthy in her son; Hyperlipidemia in her father and mother; Hypertension in her brother, father, and mother; Lupus in her sister.She reports that she has never smoked. She has never used smokeless tobacco. She reports that she does not drink alcohol and does not use drugs.    ROS Review of Systems  Genitourinary:  Negative for decreased urine volume, frequency, vaginal bleeding and vaginal discharge.    Objective:  BP 112/69   Pulse 97   Temp 98.2 F (36.8 C)   Ht 5\' 2"  (1.575 m)   Wt 205 lb 9.6 oz (93.3 kg)   SpO2 97%   BMI 37.60 kg/m   BP Readings from Last 3 Encounters:  11/21/22 112/69  10/13/22 104/74  07/19/22 125/84    Wt Readings from Last 3 Encounters:  11/21/22 205 lb 9.6 oz (93.3  kg)  10/13/22 203 lb 6 oz (92.3 kg)  07/19/22 197 lb 6.4 oz (89.5 kg)     Physical Exam Constitutional:      General: She is not in acute distress.    Appearance: She is well-developed.  Cardiovascular:     Rate and Rhythm: Normal rate and regular rhythm.  Pulmonary:     Breath sounds: Normal breath sounds.  Musculoskeletal:        General: Normal range of motion.  Skin:    General: Skin is warm and dry.  Neurological:     Mental Status: She is alert and oriented to person, place, and time.    Results for orders placed or performed in visit on 11/21/22  WET PREP FOR TRICH, YEAST, CLUE   Specimen: Vaginal Swab   Vaginal Swab  Result Value Ref Range   Trichomonas Exam Negative Negative   Yeast Exam Negative Negative   Clue Cell Exam Negative Negative    Increased bacteria and WBS were noted.   Assessment & Plan:   Kara Irwin was seen today for vaginitis.  Diagnoses and all orders for this visit:  Acute vaginitis -     WET PREP FOR TRICH, YEAST, CLUE  Other orders -  metroNIDAZOLE (FLAGYL) 500 MG tablet; Take 1 tablet (500 mg total) by mouth 2 (two) times daily.       I have discontinued [place]. [place]'s clindamycin. I am also having her start on metroNIDAZOLE. Additionally, I am having her maintain her OVER THE COUNTER MEDICATION, cetirizine, OVER THE COUNTER MEDICATION, rosuvastatin, fluticasone, and fluconazole.  Allergies as of 11/21/2022       Reactions   Duloxetine Hcl Nausea And Vomiting   Protonix [pantoprazole] Other (See Comments)   Severe HA, nausea, diarrhea   Amoxicillin Other (See Comments)   Amoxicillin-pot Clavulanate    Severe yeast infection   Ciprofloxacin Nausea Only   Estrogens    Birth control patch caused severe reaction        Medication List        Accurate as of November 21, 2022  5:39 PM. If you have any questions, ask your nurse or doctor.          STOP taking these medications    clindamycin 300 MG  capsule Commonly known as: Cleocin Stopped by: Mechele Claude, MD       TAKE these medications    cetirizine 10 MG tablet Commonly known as: ZYRTEC Take 1 tablet (10 mg total) by mouth daily.   fluconazole 150 MG tablet Commonly known as: Diflucan Take one tablet by mouth now. Repeat in 3 days if symptoms persist.   fluticasone 50 MCG/ACT nasal spray Commonly known as: FLONASE Place 2 sprays into both nostrils daily.   metroNIDAZOLE 500 MG tablet Commonly known as: FLAGYL Take 1 tablet (500 mg total) by mouth 2 (two) times daily. Started by: Mechele Claude, MD   OVER THE COUNTER MEDICATION Floragen   OVER THE COUNTER MEDICATION Selgevity for stress   rosuvastatin 10 MG tablet Commonly known as: CRESTOR Take 1 tablet (10 mg total) by mouth daily. (NEEDS TO BE SEEN BEFORE NEXT REFILL)         Follow-up: Return if symptoms worsen or fail to improve.  Mechele Claude, M.D.

## 2022-11-24 ENCOUNTER — Other Ambulatory Visit: Payer: Self-pay | Admitting: Family Medicine

## 2022-11-24 ENCOUNTER — Encounter: Payer: Self-pay | Admitting: Family Medicine

## 2022-11-24 MED ORDER — METRONIDAZOLE 0.75 % VA GEL: 1.0000 | Freq: Two times a day (BID) | VAGINAL | 0 refills | Status: DC

## 2022-12-27 ENCOUNTER — Telehealth: Payer: Self-pay | Admitting: Family Medicine

## 2022-12-27 ENCOUNTER — Other Ambulatory Visit: Payer: Self-pay | Admitting: Family Medicine

## 2022-12-27 DIAGNOSIS — L309 Dermatitis, unspecified: Secondary | ICD-10-CM

## 2022-12-27 NOTE — Telephone Encounter (Signed)
Please Review

## 2022-12-27 NOTE — Telephone Encounter (Signed)
Referral placed, as requested WS 

## 2022-12-27 NOTE — Telephone Encounter (Signed)
REFERRAL REQUEST Telephone Note  Have you been seen at our office for this problem? Yes, pt has seen Dr. Darlyn Read and tried prednisone (Advise that they may need an appointment with their PCP before a referral can be done)  Reason for Referral: skin rash,not sure if psoriasis?  Referral discussed with patient: yes  Best contact number of patient for referral team: [phone]    Has patient been seen by a specialist for this issue before: no  Patient provider preference for referral: [facility] fax#[phone] Patient location preference for referral: [place]  Pt was told they have opening on 12/29/2022 would like to get in by then but she is aware this can take a week or longer   Patient notified that referrals can take up to a week or longer to process. If they haven't heard anything within a week they should call back and speak with the referral department.

## 2023-01-21 DIAGNOSIS — G44209 Tension-type headache, unspecified, not intractable: Secondary | ICD-10-CM | POA: Diagnosis not present

## 2023-01-21 DIAGNOSIS — E860 Dehydration: Secondary | ICD-10-CM | POA: Diagnosis not present

## 2023-01-21 DIAGNOSIS — T675XXA Heat exhaustion, unspecified, initial encounter: Secondary | ICD-10-CM | POA: Diagnosis not present

## 2023-01-21 DIAGNOSIS — R519 Headache, unspecified: Secondary | ICD-10-CM | POA: Diagnosis not present

## 2023-02-17 ENCOUNTER — Other Ambulatory Visit: Payer: Self-pay | Admitting: Family Medicine

## 2023-02-17 DIAGNOSIS — N760 Acute vaginitis: Secondary | ICD-10-CM

## 2023-02-20 MED ORDER — FLUCONAZOLE 150 MG PO TABS: ORAL_TABLET | ORAL | 0 refills | Status: DC

## 2023-04-10 ENCOUNTER — Encounter: Payer: Self-pay | Admitting: Family Medicine

## 2023-04-10 ENCOUNTER — Ambulatory Visit: Payer: 59 | Admitting: Family Medicine

## 2023-04-10 VITALS — BP 118/82 | HR 80 | Temp 98.4°F | Ht 62.0 in | Wt 204.4 lb

## 2023-04-10 DIAGNOSIS — N760 Acute vaginitis: Secondary | ICD-10-CM

## 2023-04-10 DIAGNOSIS — R300 Dysuria: Secondary | ICD-10-CM | POA: Diagnosis not present

## 2023-04-10 LAB — URINALYSIS, ROUTINE W REFLEX MICROSCOPIC
Bilirubin, UA: NEGATIVE
Glucose, UA: NEGATIVE
Ketones, UA: NEGATIVE
Leukocytes,UA: NEGATIVE
Nitrite, UA: NEGATIVE
Protein,UA: NEGATIVE
RBC, UA: NEGATIVE
Specific Gravity, UA: 1.02 (ref 1.005–1.030)
Urobilinogen, Ur: 0.2 mg/dL (ref 0.2–1.0)
pH, UA: 7 (ref 5.0–7.5)

## 2023-04-10 LAB — WET PREP FOR TRICH, YEAST, CLUE
Clue Cell Exam: NEGATIVE
Trichomonas Exam: NEGATIVE
Yeast Exam: POSITIVE — AB

## 2023-04-10 MED ORDER — FLUCONAZOLE 150 MG PO TABS
ORAL_TABLET | ORAL | 0 refills | Status: DC
Start: 2023-04-10 — End: 2023-09-27

## 2023-04-10 NOTE — Progress Notes (Signed)
   Acute Office Visit  Subjective:     Patient ID: Kara Irwin, female    DOB: 1986-03-29, 37 y.o.   MRN: [id number]  Chief Complaint  Patient presents with   Vaginitis    Vaginal Discharge The patient's primary symptoms include vaginal discharge. This is a new problem. The current episode started in the past 7 days. The problem has been unchanged. The patient is experiencing no pain. The problem affects both sides. She is not pregnant. Pertinent negatives include no abdominal pain, chills, constipation, discolored urine, dysuria, fever, flank pain, frequency, hematuria, nausea, urgency or vomiting. Associated symptoms comments: Vaginal itching and burning. The vaginal discharge was milky and white. There has been no bleeding. She has not been passing clots. She has not been passing tissue. She has tried antifungals for the symptoms. The treatment provided no relief. She is sexually active. No, her partner does not have an STD. She uses nothing for contraception. There is no history of an STD.    Review of Systems  Constitutional:  Negative for chills and fever.  Gastrointestinal:  Negative for abdominal pain, constipation, nausea and vomiting.  Genitourinary:  Positive for vaginal discharge. Negative for dysuria, flank pain, frequency, hematuria and urgency.        Objective:    BP 118/82   Pulse 80   Temp 98.4 F (36.9 C) (Temporal)   Ht 5\' 2"  (1.575 m)   Wt 204 lb 6 oz (92.7 kg)   SpO2 98%   BMI 37.38 kg/m    Physical Exam Vitals and nursing note reviewed.  Constitutional:      General: She is not in acute distress.    Appearance: She is not ill-appearing, toxic-appearing or diaphoretic.  Abdominal:     General: There is no distension.     Tenderness: There is no abdominal tenderness. There is no right CVA tenderness, left CVA tenderness, guarding or rebound.  Musculoskeletal:     Right lower leg: No edema.     Left lower leg: No edema.  Neurological:      General: No focal deficit present.     Mental Status: She is alert and oriented to person, place, and time.  Psychiatric:        Mood and Affect: Mood normal.        Behavior: Behavior normal.     No results found for any visits on 04/10/23.      Assessment & Plan:   Itzelle was seen today for vaginitis.  Diagnoses and all orders for this visit:  Acute vaginitis  + yeast on wet prep. Negative UA.  -     WET PREP FOR TRICH, YEAST, CLUE -     Urinalysis, Routine w reflex microscopic -     fluconazole (DIFLUCAN) 150 MG tablet; Take one tablet by mouth now. Repeat in 3 days if symptoms persist.  Follow up as needed.   The patient indicates understanding of these issues and agrees with the plan.  Gabriel Earing, FNP

## 2023-04-12 ENCOUNTER — Ambulatory Visit: Payer: 59 | Admitting: Dermatology

## 2023-08-16 ENCOUNTER — Telehealth: Admitting: Family Medicine

## 2023-08-16 ENCOUNTER — Encounter: Payer: Self-pay | Admitting: Family Medicine

## 2023-08-16 DIAGNOSIS — R519 Headache, unspecified: Secondary | ICD-10-CM

## 2023-08-16 DIAGNOSIS — M549 Dorsalgia, unspecified: Secondary | ICD-10-CM

## 2023-08-16 DIAGNOSIS — R6889 Other general symptoms and signs: Secondary | ICD-10-CM

## 2023-08-16 DIAGNOSIS — R059 Cough, unspecified: Secondary | ICD-10-CM

## 2023-08-16 MED ORDER — CHLORPHEN-PE-ACETAMINOPHEN 4-10-325 MG PO TABS: 1.0000 | ORAL_TABLET | Freq: Four times a day (QID) | ORAL | 0 refills | Status: AC | PRN

## 2023-08-16 MED ORDER — BENZONATATE 100 MG PO CAPS: 100.0000 mg | ORAL_CAPSULE | Freq: Two times a day (BID) | ORAL | 0 refills | Status: DC | PRN

## 2023-08-16 NOTE — Progress Notes (Signed)
   Virtual Visit via video Note   Due to COVID-19 pandemic this visit was conducted virtually. This visit type was conducted due to national recommendations for restrictions regarding the COVID-19 Pandemic (e.g. social distancing, sheltering in place) in an effort to limit this patient's exposure and mitigate transmission in our community. All issues noted in this document were discussed and addressed.  A physical exam was not performed with this format.  I connected with  Kara Irwin  on 08/16/23 at 1442 by video and verified that I am speaking with the correct person using two identifiers. Kara Irwin is currently located at home and no one is currently with her during the visit. The provider, Gabriel Earing, FNP is located in their office at time of visit.  I discussed the limitations, risks, security and privacy concerns of performing an evaluation and management service by video  and the availability of in person appointments. I also discussed with the patient that there may be a patient responsible charge related to this service. The patient expressed understanding and agreed to proceed.  CC: flu  History and Present Illness:  Kara Irwin reports cough, congestion for 3 days. Coughing up yellow mucus. Also has upper back pain and HA. Had fever initially, now resolved. Denies nausea, vomiting, diarrhea, chills, sore throat, body aches. Husband tested positive for Flu A earlier this week. She has not been evaluated since symtpoms started. Has been taking tylenol, mucinex D with some relief. Denies hx of asthma, COPD, bronchitis. Pulse ox is currently 96%.   ROS As per HPI.    Observations/Objective: Alert and oriented. Respirations unlabored. No cyanosis. Non toxic appearing. Normal mood and behavior.   Assessment and Plan: [name] was seen today for influenza.  Diagnoses and all orders for this visit:  Flu-like symptoms Discussed likely flu A given close exposure.  Declined tamilfu today. Tessalon perles and noral AD prn as below. Do not take other tylenol or decongestants with Noral AD. Discussed symptomatic care and return precautions.  -     Chlorphen-PE-Acetaminophen 4-10-325 MG TABS; Take 1 tablet by mouth every 6 (six) hours as needed. -     benzonatate (TESSALON) 100 MG capsule; Take 1 capsule (100 mg total) by mouth 2 (two) times daily as needed for cough.     Follow Up Instructions: As needed.     I discussed the assessment and treatment plan with the patient. The patient was provided an opportunity to ask questions and all were answered. The patient agreed with the plan and demonstrated an understanding of the instructions.   The patient was advised to call back or seek an in-person evaluation if the symptoms worsen or if the condition fails to improve as anticipated.  The above assessment and management plan was discussed with the patient. The patient verbalized understanding of and has agreed to the management plan. Patient is aware to call the clinic if symptoms persist or worsen. Patient is aware when to return to the clinic for a follow-up visit. Patient educated on when it is appropriate to go to the emergency department.   Time call ended: 1449  I provided 7 minutes of face-to-face time during this encounter.    Gabriel Earing, FNP

## 2023-09-27 ENCOUNTER — Encounter: Payer: Self-pay | Admitting: Family Medicine

## 2023-09-27 ENCOUNTER — Ambulatory Visit: Admitting: Family Medicine

## 2023-09-27 VITALS — BP 122/77 | HR 94 | Temp 97.3°F | Ht 62.0 in | Wt 194.6 lb

## 2023-09-27 DIAGNOSIS — N760 Acute vaginitis: Secondary | ICD-10-CM

## 2023-09-27 DIAGNOSIS — B3731 Acute candidiasis of vulva and vagina: Secondary | ICD-10-CM

## 2023-09-27 DIAGNOSIS — B9689 Other specified bacterial agents as the cause of diseases classified elsewhere: Secondary | ICD-10-CM

## 2023-09-27 DIAGNOSIS — N926 Irregular menstruation, unspecified: Secondary | ICD-10-CM | POA: Diagnosis not present

## 2023-09-27 DIAGNOSIS — N898 Other specified noninflammatory disorders of vagina: Secondary | ICD-10-CM

## 2023-09-27 LAB — URINALYSIS, ROUTINE W REFLEX MICROSCOPIC
Bilirubin, UA: NEGATIVE
Glucose, UA: NEGATIVE
Ketones, UA: NEGATIVE
Leukocytes,UA: NEGATIVE
Nitrite, UA: NEGATIVE
Protein,UA: NEGATIVE
RBC, UA: NEGATIVE
Specific Gravity, UA: 1.01 (ref 1.005–1.030)
Urobilinogen, Ur: 0.2 mg/dL (ref 0.2–1.0)
pH, UA: 6.5 (ref 5.0–7.5)

## 2023-09-27 LAB — WET PREP FOR TRICH, YEAST, CLUE
Clue Cell Exam: POSITIVE — AB
Trichomonas Exam: NEGATIVE
Yeast Exam: POSITIVE — AB

## 2023-09-27 LAB — PREGNANCY, URINE: Preg Test, Ur: NEGATIVE

## 2023-09-27 MED ORDER — METRONIDAZOLE 500 MG PO TABS: 500.0000 mg | ORAL_TABLET | Freq: Two times a day (BID) | ORAL | 0 refills | Status: DC

## 2023-09-27 MED ORDER — FLUCONAZOLE 150 MG PO TABS: ORAL_TABLET | ORAL | 0 refills | Status: DC

## 2023-09-27 NOTE — Progress Notes (Signed)
 Subjective:  Patient ID: Kara Irwin, female    DOB: May 25, 1986, 38 y.o.   MRN: [id number]  Patient Care Team: Roise Cleaver, MD as PCP - General (Family Medicine) Vinetta Greening, DO as Consulting Physician (Internal Medicine)   Chief Complaint:  Vaginal Itching and Dysuria (X 5 days)   HPI: Kara Irwin is a 38 y.o. female presenting on 09/27/2023 for Vaginal Itching and Dysuria (X 5 days)   History of Present Illness   Kara Irwin is a 38 year old female who presents with itching and burning, suspecting a yeast infection.  She has been experiencing itching and burning since Friday, which she associates with symptoms similar to previous yeast infections. She has been under a lot of stress recently and is unsure if this is contributing to her symptoms.  She denies any discharge or odor but notes that her urine appears cloudy. She has been sexually active with her husband, with no new partners, and denies any possibility of sexually transmitted diseases.  She recalls being diagnosed with bacterial vaginosis last year, which prompts her to seek medical evaluation rather than assuming a yeast infection. She is concerned about potential premenopausal changes as she is 38 years old.  She took an antibiotic for a sinus infection two weeks ago, which she acknowledges could have disrupted her normal flora. She has not started her period this month, which she finds unusual, as the last time she missed a period was during pregnancy.  No fever, chills, or pain, but describes the itching and burning as 'driving nuts'. She has not experienced any recent illness aside from the sinus infection.          Relevant past medical, surgical, family, and social history reviewed and updated as indicated.  Allergies and medications reviewed and updated. Data reviewed: Chart in Epic.   Past Medical History:  Diagnosis Date   Hyperlipidemia     Past Surgical History:   Procedure Laterality Date   CESAREAN SECTION     WISDOM TOOTH EXTRACTION      Social History   Socioeconomic History   Marital status: Single    Spouse name: Not on file   Number of children: Not on file   Years of education: Not on file   Highest education level: Not on file  Occupational History   Not on file  Tobacco Use   Smoking status: Never   Smokeless tobacco: Never  Vaping Use   Vaping status: Never Used  Substance and Sexual Activity   Alcohol use: No   Drug use: No   Sexual activity: Not on file  Other Topics Concern   Not on file  Social History Narrative   Not on file   Social Drivers of Health   Financial Resource Strain: Not on file  Food Insecurity: Not on file  Transportation Needs: Not on file  Physical Activity: Not on file  Stress: Not on file  Social Connections: Not on file  Intimate Partner Violence: Not on file    Outpatient Encounter Medications as of 09/27/2023  Medication Sig   cetirizine  (ZYRTEC ) 10 MG tablet Take 1 tablet (10 mg total) by mouth daily.   Chlorphen-PE-Acetaminophen  4-10-325 MG TABS Take 1 tablet by mouth every 6 (six) hours as needed.   fluconazole  (DIFLUCAN ) 150 MG tablet 1 po q week x 4 weeks   fluticasone  (FLONASE ) 50 MCG/ACT nasal spray Place 2 sprays into both nostrils daily.   metroNIDAZOLE  (FLAGYL ) 500  MG tablet Take 1 tablet (500 mg total) by mouth 2 (two) times daily.   OVER THE COUNTER MEDICATION Floragen   OVER THE COUNTER MEDICATION Selgevity for stress   rosuvastatin  (CRESTOR ) 10 MG tablet Take 1 tablet (10 mg total) by mouth daily. (NEEDS TO BE SEEN BEFORE NEXT REFILL) (Patient not taking: Reported on 09/27/2023)   [DISCONTINUED] benzonatate  (TESSALON ) 100 MG capsule Take 1 capsule (100 mg total) by mouth 2 (two) times daily as needed for cough.   [DISCONTINUED] fluconazole  (DIFLUCAN ) 150 MG tablet Take one tablet by mouth now. Repeat in 3 days if symptoms persist.   No facility-administered encounter  medications on file as of 09/27/2023.    Allergies  Allergen Reactions   Duloxetine Hcl Nausea And Vomiting   Protonix  [Pantoprazole ] Other (See Comments)    Severe HA, nausea, diarrhea    Amoxicillin Other (See Comments)   Amoxicillin-Pot Clavulanate     Severe yeast infection   Ciprofloxacin Nausea Only   Estrogens     Birth control patch caused severe reaction    Pertinent ROS per HPI, otherwise unremarkable      Objective:  BP 122/77   Pulse 94   Temp (!) 97.3 F (36.3 C)   Ht 5\' 2"  (1.575 m)   Wt 194 lb 9.6 oz (88.3 kg)   LMP 08/22/2023   SpO2 99%   BMI 35.59 kg/m    Wt Readings from Last 3 Encounters:  09/27/23 194 lb 9.6 oz (88.3 kg)  04/10/23 204 lb 6 oz (92.7 kg)  11/21/22 205 lb 9.6 oz (93.3 kg)    Physical Exam Vitals and nursing note reviewed.  Constitutional:      General: She is not in acute distress.    Appearance: Normal appearance. She is obese. She is not ill-appearing, toxic-appearing or diaphoretic.  HENT:     Head: Normocephalic and atraumatic.     Nose: Nose normal.     Mouth/Throat:     Mouth: Mucous membranes are moist.  Eyes:     Conjunctiva/sclera: Conjunctivae normal.  Cardiovascular:     Rate and Rhythm: Normal rate and regular rhythm.     Heart sounds: Normal heart sounds.  Pulmonary:     Breath sounds: Normal breath sounds.  Abdominal:     General: Bowel sounds are normal.     Palpations: Abdomen is soft.     Tenderness: There is no abdominal tenderness.  Genitourinary:    Comments: Deferred, did self wet prep Musculoskeletal:     Cervical back: Neck supple.     Right lower leg: No edema.     Left lower leg: No edema.  Skin:    General: Skin is warm and dry.     Capillary Refill: Capillary refill takes less than 2 seconds.  Neurological:     General: No focal deficit present.     Mental Status: She is alert and oriented to person, place, and time.  Psychiatric:        Mood and Affect: Mood normal.        Behavior:  Behavior normal.        Thought Content: Thought content normal.        Judgment: Judgment normal.       Results for orders placed or performed in visit on 04/10/23  WET PREP FOR TRICH, YEAST, CLUE   Collection Time: 04/10/23  1:32 PM   Specimen: Vaginal Swab   Vaginal Swab  Result Value Ref Range   Trichomonas Exam  Negative Negative   Yeast Exam Positive (A) Negative   Clue Cell Exam Negative Negative  Urinalysis, Routine w reflex microscopic   Collection Time: 04/10/23  1:32 PM  Result Value Ref Range   Specific Gravity, UA 1.020 1.005 - 1.030   pH, UA 7.0 5.0 - 7.5   Color, UA Yellow Yellow   Appearance Ur Clear Clear   Leukocytes,UA Negative Negative   Protein,UA Negative Negative/Trace   Glucose, UA Negative Negative   Ketones, UA Negative Negative   RBC, UA Negative Negative   Bilirubin, UA Negative Negative   Urobilinogen, Ur 0.2 0.2 - 1.0 mg/dL   Nitrite, UA Negative Negative       Pertinent labs & imaging results that were available during my care of the patient were reviewed by me and considered in my medical decision making.  Assessment & Plan:  Draya was seen today for vaginal itching and dysuria.  Diagnoses and all orders for this visit:  Pregnancy negative. Wet prep positive for yeast and clue cells.   Vagina itching -     WET PREP FOR TRICH, YEAST, CLUE -     Urinalysis, Routine w reflex microscopic -     Urine Culture  Missed menses -     Pregnancy, urine  Bacterial vaginosis -     metroNIDAZOLE  (FLAGYL ) 500 MG tablet; Take 1 tablet (500 mg total) by mouth 2 (two) times daily.  Candida vaginitis -     fluconazole  (DIFLUCAN ) 150 MG tablet; 1 po q week x 4 weeks     Assessment and Plan    Vulvovaginal candidiasis Itching and burning in the vaginal area since Friday, with no discharge or odor. Recent antibiotic use for a sinus infection may have disrupted normal flora, leading to yeast overgrowth. Differential diagnosis includes bacterial  vaginosis, but symptoms are more consistent with yeast infection. No fever or chills reported. Cloudy urine may be related to current symptoms. - Review urine analysis, wet prep, and pregnancy test results to confirm diagnosis. - Provide education on preventive measures, including wearing cotton underwear, avoiding tight clothing, and using cotton pads or tampons. - Recommend a probiotic, such as yogurt or an over-the-counter vaginal probiotic. - Diflucan  as prescribed for yeast infection.   Bacterial vaginosis Previous diagnosis last year. Current symptoms are not typical of BV, but she is concerned due to past experience. Education provided on factors that can trigger BV and preventive measures. - Provide educational materials on preventing bacterial vaginosis, including lifestyle modifications and hygiene practices.  - Flagyl  as prescribed for BV.          Continue all other maintenance medications.  Follow up plan: Return if symptoms worsen or fail to improve.   Continue healthy lifestyle choices, including diet (rich in fruits, vegetables, and lean proteins, and low in salt and simple carbohydrates) and exercise (at least 30 minutes of moderate physical activity daily).  Educational handout given for vaginal hygiene, BV  The above assessment and management plan was discussed with the patient. The patient verbalized understanding of and has agreed to the management plan. Patient is aware to call the clinic if they develop any new symptoms or if symptoms persist or worsen. Patient is aware when to return to the clinic for a follow-up visit. Patient educated on when it is appropriate to go to the emergency department.   Kattie Parrot, FNP-C [facility] [phone]

## 2023-09-27 NOTE — Patient Instructions (Signed)
Healthy vaginal hygiene practices   -  Avoid sleeper pajamas. Nightgowns allow air to circulate.  Sleep without underpants whenever possible.  -  Wear cotton underpants during the day. Double-rinse underwear after washing to avoid residual irritants. Do not use fabric softeners for underwear and swimsuits.  - Avoid tights, leotards, leggings, "skinny" jeans, and other tight-fitting clothing. Skirts and loose-fitting pants allow air to circulate.  - Avoid pantyliners.  Instead use tampons or cotton pads.  - Daily warm bathing is helpful:     - Soak in clean water (no soap) for 10 to 15 minutes.     - Use soap to wash regions other than the genital area just before getting out of the tub or drain water and take a shower to wash your body. Limit use of any soap on genital areas. Use   fragance-free soaps.     - Rinse the genital area well and gently pat dry.  Don't rub.  Hair dryer to assist with drying can be used only if on cool setting.     - Do not use bubble baths or perfumed soaps.  - Do not use any feminine sprays, douches or powders.  These contain chemicals that will irritate the skin.  - If the genital area is tender or swollen, cool compresses may relieve the discomfort. Unscented wet wipes can be used instead of toilet paper for wiping.   - Emollients, such as Vaseline, may help protect skin and can be applied to the irritated area.  - Always remember to wipe front-to-back after bowel movements. Pat dry after urination.  - Do not sit in wet swimsuits for long periods of time after swimming  

## 2023-09-28 LAB — URINE CULTURE

## 2023-10-12 ENCOUNTER — Ambulatory Visit: Admitting: Nurse Practitioner

## 2023-10-12 ENCOUNTER — Ambulatory Visit: Payer: Self-pay | Admitting: Nurse Practitioner

## 2023-10-12 ENCOUNTER — Encounter: Payer: Self-pay | Admitting: Nurse Practitioner

## 2023-10-12 VITALS — BP 114/75 | HR 75 | Temp 97.9°F | Ht 62.0 in | Wt 193.8 lb

## 2023-10-12 DIAGNOSIS — N926 Irregular menstruation, unspecified: Secondary | ICD-10-CM | POA: Diagnosis not present

## 2023-10-12 DIAGNOSIS — N898 Other specified noninflammatory disorders of vagina: Secondary | ICD-10-CM | POA: Insufficient documentation

## 2023-10-12 DIAGNOSIS — R102 Pelvic and perineal pain unspecified side: Secondary | ICD-10-CM | POA: Insufficient documentation

## 2023-10-12 LAB — URINALYSIS, ROUTINE W REFLEX MICROSCOPIC
Bilirubin, UA: NEGATIVE
Glucose, UA: NEGATIVE
Ketones, UA: NEGATIVE
Leukocytes,UA: NEGATIVE
Nitrite, UA: NEGATIVE
Protein,UA: NEGATIVE
RBC, UA: NEGATIVE
Specific Gravity, UA: 1.01 (ref 1.005–1.030)
Urobilinogen, Ur: 0.2 mg/dL (ref 0.2–1.0)
pH, UA: 6.5 (ref 5.0–7.5)

## 2023-10-12 LAB — WET PREP FOR TRICH, YEAST, CLUE
Clue Cell Exam: NEGATIVE
Trichomonas Exam: NEGATIVE
Yeast Exam: NEGATIVE

## 2023-10-12 NOTE — Progress Notes (Signed)
 Acute Office Visit  Subjective:     Patient ID: Kara Irwin, female    DOB: November 17, 1985, 38 y.o.   MRN: [id number]  Chief Complaint  Patient presents with   Pelvic Pain    Started having pelvic pain this morning    Back Pain    Lower back pain started yesterday    Kara Irwin is a 38 year old female presenting for an acute visit with complaints of pelvic and lower back pain. She reports a history of bacterial vaginosis (BV) treated on April 30th, for which she completed the full course of prescribed medications. Yesterday, she experienced bilateral flank pain that resolved spontaneously. However, she awoke this morning with a recurrence of flank discomfort. She also reports experiencing vaginal discharge following intercourse. She denies any associated fever, itching, or other systemic symptoms. Reports he cycle has became irregular and was 3 weeks late thought she was pregnant, but it was nit the care.  Her mother started menopause at 59 year old aunt at 81 she is concerned that she might be in menopause.   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  Respiratory:  Negative for cough and shortness of breath.   Cardiovascular:  Negative for chest pain and leg swelling.  Gastrointestinal:  Negative for constipation, diarrhea, nausea and vomiting.  Genitourinary:  Positive for frequency and pelvic pain.       Vaginal discharge post coital yesterday  Musculoskeletal:  Positive for back pain.       Flank pain yesterday that is resolve today  Skin:  Negative for itching and rash.  Neurological:  Negative for dizziness and headaches.   Negative unless indicated in HPI    Objective:    BP 114/75   Pulse 75   Temp 97.9 F (36.6 C) (Temporal)   Ht 5\' 2"  (1.575 m)   Wt 193 lb 12.8 oz (87.9 kg)   LMP 08/22/2023   SpO2 99%   BMI 35.45 kg/m  BP Readings from Last 3 Encounters:  10/12/23 114/75  09/27/23 122/77  04/10/23 118/82   Wt Readings from Last 3 Encounters:  10/12/23 193 lb 12.8 oz (87.9 kg)  09/27/23 194 lb 9.6 oz (88.3 kg)  04/10/23 204 lb 6 oz (92.7 kg)      Physical Exam Vitals and nursing note reviewed.  Constitutional:      General: She is not in acute distress.    Appearance: She is obese.  HENT:     Head: Normocephalic and atraumatic.     Nose: Nose normal.  Mouth/Throat:     Mouth: Mucous membranes are moist.  Eyes:     General: No scleral icterus.    Extraocular Movements: Extraocular movements intact.     Conjunctiva/sclera: Conjunctivae normal.     Pupils: Pupils are equal, round, and reactive to light.  Cardiovascular:     Heart sounds: Normal heart sounds.  Pulmonary:     Effort: Pulmonary effort is normal.     Breath sounds: Normal breath sounds.  Abdominal:     General: Bowel sounds are normal.     Palpations: Abdomen is soft.     Tenderness: There is no abdominal tenderness. There is no right CVA tenderness or left CVA tenderness.  Musculoskeletal:        General: Normal range of motion.     Right  lower leg: No edema.     Left lower leg: No edema.  Skin:    General: Skin is warm and dry.     Findings: No rash.  Neurological:     Mental Status: She is alert and oriented to person, place, and time.  Psychiatric:        Mood and Affect: Mood normal.        Behavior: Behavior normal.        Thought Content: Thought content normal.        Judgment: Judgment normal.    Urine dipstick shows negative for all components.  Micro exam: not done.  No results found for any visits on 10/12/23.      Assessment & Plan:  Vaginal discharge -     WET PREP FOR TRICH, YEAST, CLUE  Pelvic pain -     Urinalysis, Routine w reflex microscopic   Kara Irwin is a 38 year old Caucasian female seen today for possible UTI, no acute distress UA and BV negative TSH ordered since client is complaining of irregular cycle She is concerned about possible menopause advised to follow-up with PCP to have hormones level checked  - void post Coital- use condoms   The above assessment and management plan was discussed with the patient. The patient verbalized understanding of and has agreed to the management plan. Patient is aware to call the clinic if they develop any new symptoms or if symptoms persist or worsen. Patient is aware when to return to the clinic for a follow-up visit. Patient educated on when it is appropriate to go to the emergency department.  Return if symptoms worsen or fail to improve.  Kara Kunath [place] Thompson, DNP [facility] [address] [place], [place] [address] [phone]  Note: This document was prepared by [employer] voice dictation technology and any errors that results from this process are unintentional.

## 2023-10-19 ENCOUNTER — Ambulatory Visit: Admitting: Family Medicine

## 2023-10-19 ENCOUNTER — Ambulatory Visit: Payer: Self-pay

## 2023-10-19 ENCOUNTER — Encounter: Payer: Self-pay | Admitting: Family Medicine

## 2023-10-19 VITALS — BP 112/77 | HR 81 | Temp 97.9°F | Ht 61.0 in | Wt 194.0 lb

## 2023-10-19 DIAGNOSIS — L2084 Intrinsic (allergic) eczema: Secondary | ICD-10-CM

## 2023-10-19 DIAGNOSIS — W57XXXA Bitten or stung by nonvenomous insect and other nonvenomous arthropods, initial encounter: Secondary | ICD-10-CM | POA: Diagnosis not present

## 2023-10-19 MED ORDER — TRIAMCINOLONE ACETONIDE 0.1 % EX CREA: 1.0000 | TOPICAL_CREAM | Freq: Three times a day (TID) | CUTANEOUS | 0 refills | Status: AC

## 2023-10-19 NOTE — Progress Notes (Unsigned)
   Subjective:  Patient ID: Kara Irwin, female    DOB: Oct 09, 1985  Age: 38 y.o. MRN: [id number]  CC: Rash (Was bitten by a tick on the back of left leg on 4/9. Patient now has a rash on lower right side of abdomen.)   HPI MICHE LOUGHRIDGE presents lesion at the right lower quadrant present for apparently a few weeks.  She is not sure.  She wants to know what it is since she is going on vacation soon.  Tick bite at the back of the left leg was about 6 weeks ago.  There has been no significant rash and no fever.  No petechiae noted.     09/27/2023   10:26 AM 11/21/2022   11:11 AM 11/21/2022   11:00 AM  Depression screen PHQ 2/9  Decreased Interest 0 1 0  Down, Depressed, Hopeless 0 0 0  PHQ - 2 Score 0 1 0  Altered sleeping 0 0   Tired, decreased energy 3 3   Change in appetite 0 0   Feeling bad or failure about yourself  0 0   Trouble concentrating 0 0   Moving slowly or fidgety/restless 0 0   Suicidal thoughts 0 0   PHQ-9 Score 3 4   Difficult doing work/chores Not difficult at all Not difficult at all     History Marlys has a past medical history of Hyperlipidemia.   She has a past surgical history that includes Wisdom tooth extraction and Cesarean section.   Her family history includes Arthritis in her father; Colon cancer (age of onset: 55) in her mother; Diabetes in her father; Gout in her father; Healthy in her son; Hyperlipidemia in her father and mother; Hypertension in her brother, father, and mother; Lupus in her sister.She reports that she has never smoked. She has never used smokeless tobacco. She reports that she does not drink alcohol and does not use drugs.    ROS Review of Systems  Objective:  BP 112/77   Pulse 81   Temp 97.9 F (36.6 C)   Ht 5\' 1"  (1.549 m)   Wt 194 lb (88 kg)   LMP 08/22/2023   SpO2 95%   BMI 36.66 kg/m   BP Readings from Last 3 Encounters:  10/19/23 112/77  10/12/23 114/75  09/27/23 122/77    Wt Readings from Last 3  Encounters:  10/19/23 194 lb (88 kg)  10/12/23 193 lb 12.8 oz (87.9 kg)  09/27/23 194 lb 9.6 oz (88.3 kg)     Physical Exam Right lower quadrant lesion appears to be a small patch of eczema.  There is a convalescent wound behind the left knee reminiscent of a healing tick bite.  There is no rash.  Slight amount of hyperemia only.    Assessment & Plan:  Tick bite, initial encounter -     Lyme Disease Serology w/Reflex -     Spotted Fever Group Antibodies  Intrinsic eczema -     Triamcinolone Acetonide; Apply 1 Application topically 3 (three) times daily. Avoid face and genitalia  Dispense: 15 g; Refill: 0     Follow-up: Return if symptoms worsen or fail to improve.  Roise Cleaver, M.D.

## 2023-10-19 NOTE — Telephone Encounter (Signed)
 Appt made

## 2023-10-19 NOTE — Telephone Encounter (Signed)
  Chief Complaint: Tick bite with rash Symptoms: Rash, fatigue Frequency: One month  Pertinent Negatives: Patient denies fever Disposition: [] ED /[] Urgent Care (no appt availability in office) / [x] Appointment(In office/virtual)/ []  [facility]/ [] Home Care/ [] Refused Recommended Disposition /[] [facility]/ []  Follow-up with PCP Additional Notes: Patient called in stating she is concerned because she was bit by a tick one month ago but noticed yesterday she has a round rash on her stomach. Patient was bit on her leg, but unsure if this rash is secondary to lyme disease/infection. Patient also having fatigue and congestion, but states her son has been sick. Patient concerned and would like evaluation and to determine if antibiotics are needed.    Copied from CRM [id number]. Topic: Clinical - Red Word Triage >> Oct 19, 2023 12:32 PM Elle L wrote: Red Word that prompted transfer to Nurse Triage: The patient was bit by a tick a month ago but she now has a rash on her stomach and she is anxious that it is lyme disease. She has been dealing with congestion and fatigue as well but her son was sick so she is unsure if it is related. Reason for Disposition  [1] Looks infected (spreading redness, pus) AND [2] no fever  Answer Assessment - Initial Assessment Questions 1. APPEARANCE of RASH: "Describe the rash."      Spread out with redness 2. LOCATION: "Where is the rash located?"      Stomach  3. NUMBER: "How many spots are there?"      1 spot 4. SIZE: "How big are the spots?" (Inches, centimeters or compare to size of a coin)      Dime  5. ONSET: "When did the rash start?"      Last night  6. ITCHING: "Does the rash itch?" If Yes, ask: "How bad is the itch?"  (Scale 0-10; or none, mild, moderate, severe)     No 7. PAIN: "Does the rash hurt?" If Yes, ask: "How bad is the pain?"  (Scale 0-10; or none, mild, moderate, severe)    - NONE (0): no pain    - MILD (1-3): doesn't  interfere with normal activities     - MODERATE (4-7): interferes with normal activities or awakens from sleep     - SEVERE (8-10): excruciating pain, unable to do any normal activities     None 8. OTHER SYMPTOMS: "Do you have any other symptoms?" (e.g., fever)     Fatigue and congestion  Protocols used: Rash or Redness - Localized-A-AH

## 2023-10-20 LAB — LYME DISEASE SEROLOGY W/REFLEX: Lyme Total Antibody EIA: NEGATIVE

## 2023-10-20 LAB — SPOTTED FEVER GROUP ANTIBODIES
Spotted Fever Group IgG: <1:64 {titer}
Spotted Fever Group IgM: <1:64 {titer}

## 2023-10-21 ENCOUNTER — Encounter: Payer: Self-pay | Admitting: Family Medicine

## 2023-10-22 ENCOUNTER — Ambulatory Visit: Payer: Self-pay | Admitting: Family Medicine

## 2023-10-24 ENCOUNTER — Encounter: Payer: Self-pay | Admitting: Family Medicine

## 2023-11-08 ENCOUNTER — Ambulatory Visit: Admitting: Family Medicine

## 2023-11-08 ENCOUNTER — Encounter: Payer: Self-pay | Admitting: Family Medicine

## 2023-11-08 VITALS — BP 118/78 | HR 70 | Temp 98.0°F | Ht 61.0 in | Wt 196.0 lb

## 2023-11-08 DIAGNOSIS — R5383 Other fatigue: Secondary | ICD-10-CM

## 2023-11-08 DIAGNOSIS — E559 Vitamin D deficiency, unspecified: Secondary | ICD-10-CM | POA: Diagnosis not present

## 2023-11-08 DIAGNOSIS — Z0000 Encounter for general adult medical examination without abnormal findings: Secondary | ICD-10-CM

## 2023-11-08 DIAGNOSIS — N912 Amenorrhea, unspecified: Secondary | ICD-10-CM | POA: Diagnosis not present

## 2023-11-08 DIAGNOSIS — Z0001 Encounter for general adult medical examination with abnormal findings: Secondary | ICD-10-CM | POA: Diagnosis not present

## 2023-11-08 MED ORDER — PREDNISONE 20 MG PO TABS: ORAL_TABLET | ORAL | 0 refills | Status: DC

## 2023-11-08 NOTE — Progress Notes (Signed)
 Subjective:  Patient ID: Kara Irwin, female    DOB: [birth year]  Age: 38 y.o. MRN: [id number]  CC: Annual Exam (Fatigued all the time/ needs labs) and Back Pain (3 weeks of lower back pain. No injury but does pick up 47 lbs son regularly. )   HPI Kara Irwin presents for Annual Physical.  Sees Dr. Glenford Irwin at [facility] for pap/ gyn.   Hurt [place]. Ongoing now for 3 weeks. Using tylenol  and aleve to get through the day.      11/08/2023    8:53 AM 09/27/2023   10:26 AM 11/21/2022   11:11 AM  Depression screen PHQ 2/9  Decreased Interest 1 0 1  Down, Depressed, Hopeless 1 0 0  PHQ - 2 Score 2 0 1  Altered sleeping 0 0 0  Tired, decreased energy 3 3 3   Change in appetite 0 0 0  Feeling bad or failure about yourself  0 0 0  Trouble concentrating 0 0 0  Moving slowly or fidgety/restless 0 0 0  Suicidal thoughts 0 0 0  PHQ-9 Score 5 3 4   Difficult doing work/chores Not difficult at all Not difficult at all Not difficult at all    History Kara Irwin has a past medical history of Hyperlipidemia.   Kara Irwin has a past surgical history that includes Wisdom tooth extraction and Cesarean section.   Kara Irwin family history includes Arthritis in Kara Irwin father; Colon cancer (age of onset: 32) in Kara Irwin mother; Diabetes in Kara Irwin father; Gout in Kara Irwin father; Healthy in Kara Irwin son; Hyperlipidemia in Kara Irwin father and mother; Hypertension in Kara Irwin brother, father, and mother; Lupus in Kara Irwin sister.Kara Irwin reports that Kara Irwin has never smoked. Kara Irwin has never used smokeless tobacco. Kara Irwin reports that Kara Irwin does not drink alcohol and does not use drugs.    ROS Review of Systems  Constitutional:  Positive for fatigue (decreased energy, tired all the time.). Negative for appetite change, chills, diaphoresis, fever and unexpected weight change.  HENT:  Negative for congestion, ear pain, hearing loss, postnasal drip, rhinorrhea, sneezing, sore throat and trouble swallowing.   Eyes:  Negative for pain.   Respiratory:  Negative for cough, chest tightness and shortness of breath.   Cardiovascular:  Negative for chest pain and palpitations.  Gastrointestinal:  Negative for abdominal pain, constipation, diarrhea, nausea and vomiting.  Endocrine: Negative for cold intolerance, heat intolerance, polydipsia, polyphagia and polyuria.  Genitourinary:  Negative for dysuria, frequency and menstrual problem.  Musculoskeletal:  Negative for arthralgias and joint swelling.  Skin:  Negative for rash.  Allergic/Immunologic: Negative for environmental allergies.  Neurological:  Negative for dizziness, weakness, numbness and headaches.  Psychiatric/Behavioral:  Negative for agitation and dysphoric mood.     Objective:  BP 118/78   Pulse 70   Temp 98 F (36.7 C)   Ht 5' 1 (1.549 m)   Wt 196 lb (88.9 kg)   SpO2 99%   BMI 37.03 kg/m   BP Readings from Last 3 Encounters:  11/08/23 118/78  10/19/23 112/77  10/12/23 114/75    Wt Readings from Last 3 Encounters:  11/08/23 196 lb (88.9 kg)  10/19/23 194 lb (88 kg)  10/12/23 193 lb 12.8 oz (87.9 kg)     Physical Exam Constitutional:      General: Kara Irwin is not in acute distress.    Appearance: Kara Irwin is well-developed.  HENT:     Head: Normocephalic and atraumatic.  Eyes:     Conjunctiva/sclera: Conjunctivae  normal.     Pupils: Pupils are equal, round, and reactive to light.  Neck:     Thyroid : No thyromegaly.  Cardiovascular:     Rate and Rhythm: Normal rate and regular rhythm.     Heart sounds: Normal heart sounds. No murmur heard. Pulmonary:     Effort: Pulmonary effort is normal. No respiratory distress.     Breath sounds: Normal breath sounds. No wheezing or rales.  Abdominal:     General: Bowel sounds are normal. There is no distension.     Palpations: Abdomen is soft.     Tenderness: There is no abdominal tenderness.  Musculoskeletal:        General: No swelling (+). Normal range of motion.     Cervical back: Normal range of  motion and neck supple.  Lymphadenopathy:     Cervical: No cervical adenopathy.  Skin:    General: Skin is warm and dry.  Neurological:     Mental Status: Kara Irwin is alert and oriented to person, place, and time.  Psychiatric:        Behavior: Behavior normal.        Thought Content: Thought content normal.        Judgment: Judgment normal.      Assessment & Plan:  Annual physical exam -     CBC with Differential/Platelet -     CMP14+EGFR -     Lipid panel  Fatigue, unspecified type -     TSH  Vitamin D  deficiency -     VITAMIN D  25 Hydroxy (Vit-D Deficiency, Fractures)  Amenorrhea -     hCG, serum, qualitative  Other orders -     predniSONE ; One twice daily with food for 5 days  Dispense: 10 tablet; Refill: 0  Discussed recurrent BV. I recommend Kara Irwin husband come in for treatment. Kara Irwin will have pap soon with GYN with further attn. To Kara Irwin recurrent BV.    Follow-up: Return in about 1 year (around 11/07/2024) for [facility] physical.  Kara Irwin, M.D.

## 2023-11-08 NOTE — Patient Instructions (Addendum)
 Vaginal Infection (Bacterial Vaginosis): What to Know  Bacterial vaginosis is an infection of the vagina. It happens when the balance of normal germs (bacteria) in the vagina changes. If you don't get treated, it can make it easier for you to get other infections from sex. These are called sexually transmitted infections (STIs). If you're pregnant, you need to get treated right away. This infection can cause a baby to be born early or at a low birth weight. What are the causes? This infection happens when too many harmful germs grow in the vagina. You can't get this infection from toilet seats, bedsheets, swimming pools, or things that touch your vagina. What increases the risk? Having sex with a new person or more than one person. Having sex without protection. Douching. Having an intrauterine device (IUD). Smoking. Using drugs or drinking alcohol. These can lead you to do risky things. Taking certain antibiotics. Being pregnant. What are the signs or symptoms? Some females have no symptoms. Symptoms may include: A gray or white discharge from your vagina. It can be watery or foamy. A fishy smell. This can happen after sex or during your menstrual period. Itching in and around your vagina. Burning or pain when you pee. How is this treated? This infection is treated with antibiotics. These may be given to you as: A pill. A cream for your vagina. A medicine that you put into your vagina (suppository). If the infection comes back, you may need more antibiotics. Follow these instructions at home: Medicines Take your medicines as told. Take or use your antibiotics as told. Do not stop using them even if you start to feel better. General instructions If the person you have sex with is a female, tell her that you have this infection. She will need to follow up with her doctor. Female partners don't need to be treated. Do not have sex until you finish treatment. Drink more fluids as  told. Keep your vagina and butt clean. Wash these areas with warm water each day. Wipe from front to back after you poop. If you're breastfeeding a baby, talk to your doctor if you should keep doing so during treatment. How is this prevented? Self-care Do not douche. Do not use deodorant sprays on your vagina. Wear cotton underwear. Do not wear tight pants and pantyhose, especially in the summer. Safe sex Use condoms the correct way and every time you have sex. Use dental dams to protect yourself during oral sex. Limit how many people you have sex with. Get tested for STIs. The person you have sex with should also get tested. Drugs and alcohol Do not smoke, vape, or use nicotine or tobacco. Do not use drugs. Limit the amount of alcohol you drink because it can lead you to do risky things. Where to find more information To learn more: Go to TonerPromos.no. Click Health Topics A-Z. Type bacterial vaginosis in the search bar. American Sexual Health Association (ASHA): ashasexualhealth.org U.[place]. Department of Health and [employer], Office on Women's Health: TravelLesson.ca Contact a doctor if: Your symptoms don't get better, even after treatment. You have more discharge or pain when you pee. You have a fever or chills. You have pain in your belly or in the area between your hips. You have pain during sex. You bleed from your vagina between menstrual periods. This information is not intended to replace advice given to you by your health care provider. Make sure you discuss any questions you have with your health care provider. Document  Revised: 11/02/2022 Document Reviewed: 11/02/2022 Elsevier Patient Education  2024 [employer].Back Exercises The following exercises strengthen the muscles that help to support the trunk (torso) and back. They also help to keep the lower back flexible. Doing these exercises can help to prevent or lessen existing low back pain. If you have back pain  or discomfort, try doing these exercises 2-3 times each day or as told by your health care provider. As your pain improves, do them once each day, but increase the number of times that you repeat the steps for each exercise (do more repetitions). To prevent the recurrence of back pain, continue to do these exercises once each day or as told by your health care provider. Do exercises exactly as told by your health care provider and adjust them as directed. It is normal to feel mild stretching, pulling, tightness, or discomfort as you do these exercises, but you should stop right away if you feel sudden pain or your pain gets worse. Exercises Single knee to chest Repeat these steps 3-5 times for each leg: Lie on your back on a firm bed or the floor with your legs extended. Bring one knee to your chest. Your other leg should stay extended and in contact with the floor. Hold your knee in place by grabbing your knee or thigh with both hands and hold. Pull on your knee until you feel a gentle stretch in your lower back or buttocks. Hold the stretch for 10-30 seconds. Slowly release and straighten your leg.  Pelvic tilt Repeat these steps 5-10 times: Lie on your back on a firm bed or the floor with your legs extended. Bend your knees so they are pointing toward the ceiling and your feet are flat on the floor. Tighten your lower abdominal muscles to press your lower back against the floor. This motion will tilt your pelvis so your tailbone points up toward the ceiling instead of pointing to your feet or the floor. With gentle tension and even breathing, hold this position for 5-10 seconds.  Cat-cow Repeat these steps until your lower back becomes more flexible: Get into a hands-and-knees position on a firm bed or the floor. Keep your hands under your shoulders, and keep your knees under your hips. You may place padding under your knees for comfort. Let your head hang down toward your chest. Contract  your abdominal muscles and point your tailbone toward the floor so your lower back becomes rounded like the back of a cat. Hold this position for 5 seconds. Slowly lift your head, let your abdominal muscles relax, and point your tailbone up toward the ceiling so your back forms a sagging arch like the back of a cow. Hold this position for 5 seconds.  Press-ups Repeat these steps 5-10 times: Lie on your abdomen (face-down) on a firm bed or the floor. Place your palms near your head, about shoulder-width apart. Keeping your back as relaxed as possible and keeping your hips on the floor, slowly straighten your arms to raise the top half of your body and lift your shoulders. Do not use your back muscles to raise your upper torso. You may adjust the placement of your hands to make yourself more comfortable. Hold this position for 5 seconds while you keep your back relaxed. Slowly return to lying flat on the floor.  Bridges Repeat these steps 10 times: Lie on your back on a firm bed or the floor. Bend your knees so they are pointing toward the ceiling and  your feet are flat on the floor. Your arms should be flat at your sides, next to your body. Tighten your buttocks muscles and lift your buttocks off the floor until your waist is at almost the same height as your knees. You should feel the muscles working in your buttocks and the back of your thighs. If you do not feel these muscles, slide your feet 1-2 inches (2.5-5 cm) farther away from your buttocks. Hold this position for 3-5 seconds. Slowly lower your hips to the starting position, and allow your buttocks muscles to relax completely. If this exercise is too easy, try doing it with your arms crossed over your chest. Abdominal crunches Repeat these steps 5-10 times: Lie on your back on a firm bed or the floor with your legs extended. Bend your knees so they are pointing toward the ceiling and your feet are flat on the floor. Cross your arms  over your chest. Tip your chin slightly toward your chest without bending your neck. Tighten your abdominal muscles and slowly raise your torso high enough to lift your shoulder blades a tiny bit off the floor. Avoid raising your torso higher than that because it can put too much stress on your lower back and does not help to strengthen your abdominal muscles. Slowly return to your starting position.  Back lifts Repeat these steps 5-10 times: Lie on your abdomen (face-down) with your arms at your sides, and rest your forehead on the floor. Tighten the muscles in your legs and your buttocks. Slowly lift your chest off the floor while you keep your hips pressed to the floor. Keep the back of your head in line with the curve in your back. Your eyes should be looking at the floor. Hold this position for 3-5 seconds. Slowly return to your starting position.  Contact a health care provider if: Your back pain or discomfort gets much worse when you do an exercise. Your worsening back pain or discomfort does not lessen within 2 hours after you exercise. If you have any of these problems, stop doing these exercises right away. Do not do them again unless your health care provider says that you can. Get help right away if: You develop sudden, severe back pain. If this happens, stop doing the exercises right away. Do not do them again unless your health care provider says that you can. This information is not intended to replace advice given to you by your health care provider. Make sure you discuss any questions you have with your health care provider. Document Revised: 06/19/2022 Document Reviewed: 07/29/2020 Elsevier Patient Education  2024 [employer].

## 2023-11-09 LAB — LIPID PANEL
Chol/HDL Ratio: 7.2 ratio — ABNORMAL HIGH (ref 0.0–4.4)
Cholesterol, Total: 308 mg/dL — ABNORMAL HIGH (ref 100–199)
HDL: 43 mg/dL (ref >39)
LDL Chol Calc (NIH): 208 mg/dL — ABNORMAL HIGH (ref 0–99)
Triglycerides: 285 mg/dL — ABNORMAL HIGH (ref 0–149)
VLDL Cholesterol Cal: 57 mg/dL — ABNORMAL HIGH (ref 5–40)

## 2023-11-09 LAB — CMP14+EGFR
ALT: 46 IU/L — ABNORMAL HIGH (ref 0–32)
AST: 27 IU/L (ref 0–40)
Albumin: 4.5 g/dL (ref 3.9–4.9)
Alkaline Phosphatase: 83 IU/L (ref 44–121)
BUN/Creatinine Ratio: 16 (ref 9–23)
BUN: 11 mg/dL (ref 6–20)
Bilirubin Total: 0.3 mg/dL (ref 0.0–1.2)
CO2: 20 mmol/L (ref 20–29)
Calcium: 10 mg/dL (ref 8.7–10.2)
Chloride: 98 mmol/L (ref 96–106)
Creatinine, Ser: 0.69 mg/dL (ref 0.57–1.00)
Globulin, Total: 3.2 g/dL (ref 1.5–4.5)
Glucose: 94 mg/dL (ref 70–99)
Potassium: 4.8 mmol/L (ref 3.5–5.2)
Sodium: 138 mmol/L (ref 134–144)
Total Protein: 7.7 g/dL (ref 6.0–8.5)
eGFR: 113 mL/min/{1.73_m2} (ref >59)

## 2023-11-09 LAB — CBC WITH DIFFERENTIAL/PLATELET
Basophils Absolute: 0 10*3/uL (ref 0.0–0.2)
Basos: 0 %
EOS (ABSOLUTE): 0.1 10*3/uL (ref 0.0–0.4)
Eos: 2 %
Hematocrit: 45.3 % (ref 34.0–46.6)
Hemoglobin: 14.5 g/dL (ref 11.1–15.9)
Immature Grans (Abs): 0 10*3/uL (ref 0.0–0.1)
Immature Granulocytes: 0 %
Lymphocytes Absolute: 2.1 10*3/uL (ref 0.7–3.1)
Lymphs: 28 %
MCH: 29.4 pg (ref 26.6–33.0)
MCHC: 32 g/dL (ref 31.5–35.7)
MCV: 92 fL (ref 79–97)
Monocytes Absolute: 0.5 10*3/uL (ref 0.1–0.9)
Monocytes: 7 %
Neutrophils Absolute: 4.7 10*3/uL (ref 1.4–7.0)
Neutrophils: 62 %
Platelets: 266 10*3/uL (ref 150–450)
RBC: 4.93 x10E6/uL (ref 3.77–5.28)
RDW: 12.6 % (ref 11.7–15.4)
WBC: 7.5 10*3/uL (ref 3.4–10.8)

## 2023-11-09 LAB — TSH: TSH: 2.7 u[IU]/mL (ref 0.450–4.500)

## 2023-11-09 LAB — HCG, SERUM, QUALITATIVE

## 2023-11-09 LAB — VITAMIN D 25 HYDROXY (VIT D DEFICIENCY, FRACTURES): Vit D, 25-Hydroxy: 26.5 ng/mL — ABNORMAL LOW (ref 30.0–100.0)

## 2023-11-14 ENCOUNTER — Ambulatory Visit: Payer: Self-pay | Admitting: Family Medicine

## 2023-11-14 ENCOUNTER — Telehealth: Payer: Self-pay | Admitting: Family Medicine

## 2023-11-14 ENCOUNTER — Other Ambulatory Visit: Payer: Self-pay | Admitting: Family Medicine

## 2023-11-14 ENCOUNTER — Encounter: Payer: Self-pay | Admitting: Family Medicine

## 2023-11-14 MED ORDER — ROSUVASTATIN CALCIUM 10 MG PO TABS: 10.0000 mg | ORAL_TABLET | Freq: Every day | ORAL | 0 refills | Status: AC

## 2023-11-14 MED ORDER — VITAMIN D (ERGOCALCIFEROL) 1.25 MG (50000 UNIT) PO CAPS: 50000.0000 [IU] | ORAL_CAPSULE | ORAL | 3 refills | Status: AC

## 2023-11-14 NOTE — Telephone Encounter (Signed)
 Please let the patient know that I sent their prescription to their pharmacy. Thanks, WS

## 2023-11-14 NOTE — Telephone Encounter (Signed)
 Copied from CRM [id number]. Topic: Clinical - Medication Question >> Nov 14, 2023  1:15 PM Yolanda T wrote: Reason for CRM: patient called to f/u on lab results to see if provider will be putting her medication for her Vit-D levels and cholesterol

## 2023-11-14 NOTE — Telephone Encounter (Signed)
 Please encourage her to take the rosuvastatin .  It was reported at the time of her physical last week that she was not taking it.  Based on these results, she clearly needs it.

## 2023-11-15 ENCOUNTER — Encounter: Payer: Self-pay | Admitting: Family Medicine

## 2024-01-16 ENCOUNTER — Ambulatory Visit: Payer: Self-pay

## 2024-01-16 NOTE — Telephone Encounter (Signed)
 Pt has appt

## 2024-01-16 NOTE — Telephone Encounter (Signed)
 FYI Only or Action Required?: FYI only for provider.  Patient was last seen in primary care on 11/08/2023 by Zollie Lowers, MD.  Called Nurse Triage reporting Dysuria, Vaginal Discharge, Vaginal Itching, and lump on back that itches and is tender to touch.  Symptoms began vaginal/urinary symptoms since yesterday, lump on back for 3 weeks.  Interventions attempted: OTC medications: hydrocortisone cream for lump on back and Other: fragrance-free.  Symptoms are: gradually worsening.  Triage Disposition: See Physician Within 24 Hours  Patient/caregiver understands and will follow disposition?: Yes      Copied from CRM #[id number]. Topic: Clinical - Red Word Triage >> Jan 16, 2024  2:29 PM Emylou G wrote: [facility] that prompted transfer to Nurse Triage: UTI?  itching, burning, discharge Reason for Disposition  MODERATE-SEVERE itching (i.e., interferes with school, work, or sleep)  Answer Assessment - Initial Assessment Questions 1. SYMPTOM: What's the main symptom you're concerned about? (e.g., pain, itching, dryness)     Vaginal discharge little bit, no smell, just white creamy Itching vaginally Burning with urination 3. ONSET: When did the  symptoms  start?     Last night 4. PAIN: Is there any pain? If Yes, ask: How bad is it? (Scale: 1-10; mild, moderate, severe)     7-8/10 5. ITCHING: Is there any itching? If Yes, ask: How bad is it? (Scale: 1-10; mild, moderate, severe)     7-8/10 6. CAUSE: What do you think is causing the discharge? Have you had the same problem before? What happened then?     Hx yeast infections and BV 7. OTHER SYMPTOMS: Do you have any other symptoms? (e.g., fever, itching, vaginal bleeding, pain with urination, injury to genital area, vaginal foreign body)     No bleeding Also want to talk to doc about spot on back been there for 3 weeks, bump there, only hurts if touch it, almost looks like 2 little teeth marks in my back, think got bit  by spider and just not going down, no symptoms with it though, about the same size, does itch, intense at night, put hydrocortisone cream on it Not used any OTC meds for vaginal/urinary symptoms, made worse in previous infection, uses only fragrance-free Lump to vaginal area after period she'll get, has currently, told that was cyst with period, blocked something 8. PREGNANCY: Is there any chance you are pregnant? When was your last menstrual period?     no  Protocols used: Vaginal Symptoms-A-AH

## 2024-01-17 ENCOUNTER — Ambulatory Visit: Admitting: Family Medicine

## 2024-01-17 ENCOUNTER — Encounter: Payer: Self-pay | Admitting: Family Medicine

## 2024-01-17 ENCOUNTER — Ambulatory Visit: Payer: Self-pay | Admitting: Family Medicine

## 2024-01-17 VITALS — BP 122/82 | HR 74 | Temp 97.7°F | Ht 61.0 in | Wt 192.8 lb

## 2024-01-17 DIAGNOSIS — N926 Irregular menstruation, unspecified: Secondary | ICD-10-CM

## 2024-01-17 DIAGNOSIS — R300 Dysuria: Secondary | ICD-10-CM

## 2024-01-17 DIAGNOSIS — B3731 Acute candidiasis of vulva and vagina: Secondary | ICD-10-CM

## 2024-01-17 DIAGNOSIS — N898 Other specified noninflammatory disorders of vagina: Secondary | ICD-10-CM | POA: Diagnosis not present

## 2024-01-17 LAB — URINALYSIS, ROUTINE W REFLEX MICROSCOPIC
Bilirubin, UA: NEGATIVE
Glucose, UA: NEGATIVE
Ketones, UA: NEGATIVE
Leukocytes,UA: NEGATIVE
Nitrite, UA: NEGATIVE
Protein,UA: NEGATIVE
RBC, UA: NEGATIVE
Specific Gravity, UA: <1.005 — ABNORMAL LOW (ref 1.005–1.030)
Urobilinogen, Ur: 0.2 mg/dL (ref 0.2–1.0)
pH, UA: 6 (ref 5.0–7.5)

## 2024-01-17 LAB — WET PREP FOR TRICH, YEAST, CLUE
Clue Cell Exam: NEGATIVE
Trichomonas Exam: NEGATIVE
Yeast Exam: POSITIVE — AB

## 2024-01-17 MED ORDER — FLUCONAZOLE 150 MG PO TABS: ORAL_TABLET | ORAL | 0 refills | Status: AC

## 2024-01-17 NOTE — Progress Notes (Signed)
 Subjective:  Patient ID: Kara Irwin, female    DOB: Jul 04, 1985, 38 y.o.   MRN: [id number]  Patient Care Team: Zollie Lowers, MD as PCP - General (Family Medicine) Cindie Carlin POUR, DO as Consulting Physician (Internal Medicine)   Chief Complaint:  Dysuria (X 2 days ), Vaginal Discharge (X 2 days ), Vaginal Itching (X 2 days ), and Insect Bite (Top of back x 3 weeks )   HPI: Kara Irwin is a 38 y.o. female presenting on 01/17/2024 for Dysuria (X 2 days ), Vaginal Discharge (X 2 days ), Vaginal Itching (X 2 days ), and Insect Bite (Top of back x 3 weeks )   Kara Irwin is a 38 year old female who presents with recurrent vaginal itching and burning after swimming.  She experiences recurrent episodes of vaginal itching and burning, which she associates with swimming. These symptoms occur immediately after swimming, despite promptly showering and changing out of her wet bathing suit. This is the first year she has experienced bacterial vaginosis and is unsure why it is happening.  She has been taking a women's probiotic for vaginal health, which she believes has helped, as she has not had bacterial vaginosis since April. However, she stopped taking the probiotic for three days due to digestive issues and subsequently experienced a recurrence of symptoms. She has tried boric acid suppositories in the past, but they were ineffective and uncomfortable for her.  She has a history of sensitive skin and avoids using products with fragrance, as advised by a dermatologist. The discomfort is more pronounced on the external genitalia rather than internally.  Her menstrual cycles have been irregular throughout her life, and she has not had a period in almost a month. Previous lab work showed low vitamin D , high cholesterol, and minimally elevated ALT, but no hormone profile was conducted.          Relevant past medical, surgical, family, and social history reviewed and updated as  indicated.  Allergies and medications reviewed and updated. Data reviewed: Chart in Epic.   Past Medical History:  Diagnosis Date   Hyperlipidemia     Past Surgical History:  Procedure Laterality Date   CESAREAN SECTION     WISDOM TOOTH EXTRACTION      Social History   Socioeconomic History   Marital status: Single    Spouse name: Not on file   Number of children: Not on file   Years of education: Not on file   Highest education level: Not on file  Occupational History   Not on file  Tobacco Use   Smoking status: Never   Smokeless tobacco: Never  Vaping Use   Vaping status: Never Used  Substance and Sexual Activity   Alcohol use: No   Drug use: No   Sexual activity: Not on file  Other Topics Concern   Not on file  Social History Narrative   Not on file   Social Drivers of Health   Financial Resource Strain: Low Risk  (11/08/2023)   Overall Financial Resource Strain (CARDIA)    Difficulty of Paying Living Expenses: Not hard at all  Food Insecurity: No Food Insecurity (11/08/2023)   Hunger Vital Sign    Worried About Running Out of Food in the Last Year: Never true    Ran Out of Food in the Last Year: Never true  Transportation Needs: No Transportation Needs (11/08/2023)   PRAPARE - Administrator, Civil Service (Medical):  No    Lack of Transportation (Non-Medical): No  Physical Activity: Insufficiently Active (11/08/2023)   Exercise Vital Sign    Days of Exercise per Week: 1 day    Minutes of Exercise per Session: 60 min  Stress: Stress Concern Present (11/08/2023)   [employer] of Occupational Health - Occupational Stress Questionnaire    Feeling of Stress : To some extent  Social Connections: Moderately Integrated (11/08/2023)   Social Connection and Isolation Panel    Frequency of Communication with Friends and Family: More than three times a week    Frequency of Social Gatherings with Friends and Family: Twice a week    Attends Religious  Services: More than 4 times per year    Active Member of [employer] or Organizations: No    Attends Banker Meetings: Never    Marital Status: Married  Catering manager Violence: Not At Risk (11/08/2023)   Humiliation, Afraid, Rape, and Kick questionnaire    Fear of Current or Ex-Partner: No    Emotionally Abused: No    Physically Abused: No    Sexually Abused: No    Outpatient Encounter Medications as of 01/17/2024  Medication Sig   cetirizine  (ZYRTEC ) 10 MG tablet Take 1 tablet (10 mg total) by mouth daily.   Chlorphen-PE-Acetaminophen  4-10-325 MG TABS Take 1 tablet by mouth every 6 (six) hours as needed.   fluconazole  (DIFLUCAN ) 150 MG tablet 1 po q week x 4 weeks   fluticasone  (FLONASE ) 50 MCG/ACT nasal spray Place 2 sprays into both nostrils daily.   OVER THE COUNTER MEDICATION Floragen   OVER THE COUNTER MEDICATION Selgevity for stress   rosuvastatin  (CRESTOR ) 10 MG tablet Take 1 tablet (10 mg total) by mouth daily. (NEEDS TO BE SEEN BEFORE NEXT REFILL)   triamcinolone  cream (KENALOG ) 0.1 % Apply 1 Application topically 3 (three) times daily. Avoid face and genitalia   Vitamin D , Ergocalciferol , (DRISDOL ) 1.25 MG (50000 UNIT) CAPS capsule Take 1 capsule (50,000 Units total) by mouth every 7 (seven) days.   [DISCONTINUED] fluconazole  (DIFLUCAN ) 150 MG tablet 1 po q week x 4 weeks (Patient not taking: Reported on 11/08/2023)   [DISCONTINUED] metroNIDAZOLE  (FLAGYL ) 500 MG tablet Take 1 tablet (500 mg total) by mouth 2 (two) times daily. (Patient not taking: Reported on 10/12/2023)   [DISCONTINUED] predniSONE  (DELTASONE ) 20 MG tablet One twice daily with food for 5 days   No facility-administered encounter medications on file as of 01/17/2024.    Allergies  Allergen Reactions   Duloxetine Hcl Nausea And Vomiting   Protonix  [Pantoprazole ] Other (See Comments)    Severe HA, nausea, diarrhea    Amoxicillin Other (See Comments)   Amoxicillin-Pot Clavulanate     Severe yeast  infection   Ciprofloxacin Nausea Only   Estrogens     Birth control patch caused severe reaction    Pertinent ROS per HPI, otherwise unremarkable      Objective:  BP 122/82   Pulse 74   Temp 97.7 F (36.5 C)   Ht 5' 1 (1.549 m)   Wt 192 lb 12.8 oz (87.5 kg)   LMP 12/30/2023   SpO2 97%   BMI 36.43 kg/m    Wt Readings from Last 3 Encounters:  01/17/24 192 lb 12.8 oz (87.5 kg)  11/08/23 196 lb (88.9 kg)  10/19/23 194 lb (88 kg)    Physical Exam Vitals and nursing note reviewed.  Constitutional:      General: She is not in acute distress.  Appearance: Normal appearance. She is obese. She is not ill-appearing, toxic-appearing or diaphoretic.  HENT:     Head: Normocephalic and atraumatic.     Nose: Nose normal.     Mouth/Throat:     Mouth: Mucous membranes are moist.  Eyes:     Pupils: Pupils are equal, round, and reactive to light.  Cardiovascular:     Rate and Rhythm: Normal rate and regular rhythm.  Pulmonary:     Effort: Pulmonary effort is normal.  Genitourinary:    Comments: Pt did self wetprep Skin:    General: Skin is warm and dry.     Capillary Refill: Capillary refill takes less than 2 seconds.  Neurological:     General: No focal deficit present.     Mental Status: She is alert and oriented to person, place, and time.  Psychiatric:        Mood and Affect: Mood normal.        Behavior: Behavior normal.        Thought Content: Thought content normal.        Judgment: Judgment normal.     Results for orders placed or performed in visit on 01/17/24  Urinalysis, Routine w reflex microscopic   Collection Time: 01/17/24  2:58 PM  Result Value Ref Range   Specific Gravity, UA <1.005 (L) 1.005 - 1.030   pH, UA 6.0 5.0 - 7.5   Color, UA Yellow Yellow   Appearance Ur Clear Clear   Leukocytes,UA Negative Negative   Protein,UA Negative Negative/Trace   Glucose, UA Negative Negative   Ketones, UA Negative Negative   RBC, UA Negative Negative    Bilirubin, UA Negative Negative   Urobilinogen, Ur 0.2 0.2 - 1.0 mg/dL   Nitrite, UA Negative Negative   Microscopic Examination Comment   WET PREP FOR TRICH, YEAST, CLUE   Collection Time: 01/17/24  2:59 PM   Specimen: Vaginal Swab   Vaginal Swab  Result Value Ref Range   Trichomonas Exam Negative Negative   Yeast Exam Positive (A) Negative   Clue Cell Exam Negative Negative       Pertinent labs & imaging results that were available during my care of the patient were reviewed by me and considered in my medical decision making.  Assessment & Plan:  Nalah was seen today for dysuria, vaginal discharge, vaginal itching and insect bite.  Diagnoses and all orders for this visit:  Vaginal discharge -     WET PREP FOR TRICH, YEAST, CLUE  Dysuria -     Urinalysis, Routine w reflex microscopic -     Urine Culture  Irregular menstrual cycle -     Hormone Panel  Vaginal candidiasis -     fluconazole  (DIFLUCAN ) 150 MG tablet; 1 po q week x 4 weeks     Recurrent vulvovaginal candidiasis Experiences recurrent vulvovaginal candidiasis, likely exacerbated by exposure to high chlorine levels in pools. Reports itching and burning sensations post-swimming, and has a history of sensitive skin. Current symptoms are consistent with yeast infection rather than bacterial vaginosis. Previous treatments with boric acid suppositories were ineffective. Currently taking a probiotic, which seems to help, as she has not had BV since April. - Prescribe Diflucan  (fluconazole ) 4-pack, instruct to take one dose initially. - If symptoms persist after 3-4 days, take a second dose. - If symptoms are not resolved after a week from the second dose, reassess. - Continue taking the current probiotic as it does not interfere with fluconazole .  Irregular menstrual  cycles Irregular menstrual cycles have been present throughout her life. Reports not having a period for almost a month. Previous tests showed normal  thyroid  function, low vitamin D , and high cholesterol, but no hormone profile was conducted. - Order hormone profile to assess for hormonal imbalances. - Instruct to stop by the lab for blood draw for hormone profile.          Continue all other maintenance medications.  Follow up plan: Return if symptoms worsen or fail to improve.   Continue healthy lifestyle choices, including diet (rich in fruits, vegetables, and lean proteins, and low in salt and simple carbohydrates) and exercise (at least 30 minutes of moderate physical activity daily).  Educational handout given for vaginal hygiene  The above assessment and management plan was discussed with the patient. The patient verbalized understanding of and has agreed to the management plan. Patient is aware to call the clinic if they develop any new symptoms or if symptoms persist or worsen. Patient is aware when to return to the clinic for a follow-up visit. Patient educated on when it is appropriate to go to the emergency department.   Rosaline Bruns, FNP-C [facility] [phone]

## 2024-01-17 NOTE — Patient Instructions (Signed)
Healthy vaginal hygiene practices   -  Avoid sleeper pajamas. Nightgowns allow air to circulate.  Sleep without underpants whenever possible.  -  Wear cotton underpants during the day. Double-rinse underwear after washing to avoid residual irritants. Do not use fabric softeners for underwear and swimsuits.  - Avoid tights, leotards, leggings, "skinny" jeans, and other tight-fitting clothing. Skirts and loose-fitting pants allow air to circulate.  - Avoid pantyliners.  Instead use tampons or cotton pads.  - Daily warm bathing is helpful:     - Soak in clean water (no soap) for 10 to 15 minutes.     - Use soap to wash regions other than the genital area just before getting out of the tub or drain water and take a shower to wash your body. Limit use of any soap on genital areas. Use   fragance-free soaps.     - Rinse the genital area well and gently pat dry.  Don't rub.  Hair dryer to assist with drying can be used only if on cool setting.     - Do not use bubble baths or perfumed soaps.  - Do not use any feminine sprays, douches or powders.  These contain chemicals that will irritate the skin.  - If the genital area is tender or swollen, cool compresses may relieve the discomfort. Unscented wet wipes can be used instead of toilet paper for wiping.   - Emollients, such as Vaseline, may help protect skin and can be applied to the irritated area.  - Always remember to wipe front-to-back after bowel movements. Pat dry after urination.  - Do not sit in wet swimsuits for long periods of time after swimming  

## 2024-01-18 ENCOUNTER — Ambulatory Visit: Payer: Self-pay

## 2024-01-18 NOTE — Telephone Encounter (Signed)
 FYI Only or Action Required?: Action required by provider: clinical question for provider and update on patient condition.  Patient was last seen in primary care on 01/17/2024 by Severa Rock HERO, FNP.  Called Nurse Triage reporting Blister.  Symptoms began today.  Interventions attempted: Other: desitin barrier ointment.  Symptoms are: gradually worsening.  Triage Disposition: Call PCP Within 24 Hours  Patient/caregiver understands and will follow disposition?: No, wishes to speak with PCP   Copied from CRM #[id number]. Topic: Clinical - Red Word Triage >> Jan 18, 2024 12:07 PM Kara Irwin wrote: Red Word that prompted transfer to Nurse Triage:   Pt went to the dr yesterday and got medication for a yeast inf and has since broke out, fluconazole  (DIFLUCAN ) 150 MG tablet. Today she is itching and its inflammed now. Reason for Disposition  [1] Applying cream or ointment AND [2] causes severe itch, burning or pain  Answer Assessment - Initial Assessment Questions 1. APPEARANCE of RASH: What does the rash look like? (e.g., blisters, dry flaky skin, red spots, redness, sores)     Blisters, raw 2. LOCATION: Where is the rash located?      Abdomen 3. NUMBER: How many spots are there?      One large-see photo sent via Mychart 4. SIZE: How big are the spots? (e.g., inches, cm; or compare to size of pinhead, tip of pen, eraser, pea)      large 5. ONSET: When did the rash start?     Within few hours of fluconazole  6. ITCHING: Does the rash itch? If Yes, ask: How bad is the itch?  (Scale 0-10; or none, mild, moderate, severe)     Extreme itching  7. PAIN: Does the rash hurt? If Yes, ask: How bad is the pain?  (Scale 0-10; or none, mild, moderate, severe)     mild 8. OTHER SYMPTOMS: Do you have any other symptoms? (e.g., fever)      Denies all other symptoms  Additional info:  1) Evaluated yesterday for vaginal itching and discharge, started fluconazole . Developed abdominal  rash with blister after fluconazole . Has used diflucan  in the past without this reaction.  2) Has been applying desitin barrier cream to rash.  3) Please follow up with patient for next steps  Protocols used: Rash or Redness - Localized-A-AH

## 2024-01-18 NOTE — Telephone Encounter (Signed)
Answered through [employer]

## 2024-01-19 ENCOUNTER — Encounter: Payer: Self-pay | Admitting: Family Medicine

## 2024-01-19 LAB — URINE CULTURE

## 2024-01-25 ENCOUNTER — Encounter: Payer: Self-pay | Admitting: Family Medicine

## 2024-02-02 ENCOUNTER — Telehealth: Payer: Self-pay | Admitting: Family Medicine

## 2024-02-02 NOTE — Telephone Encounter (Signed)
 Pt is wanting to know her hormone panel results. States that she does not feel well and would like to know sooner rather than later what is wrong with her. Stakes is PCP but Rakes ordered labs. Neither are here today. Sent message to covering for PCP.

## 2024-02-02 NOTE — Telephone Encounter (Signed)
 Pt made aware. She is frustrated that results have taken this long. Labs drawn on 8/20. I have messaged lab to see if they had any ideas. Otherwise, advised pt to call early next week to see if they have come back.

## 2024-02-02 NOTE — Telephone Encounter (Signed)
 Copied from CRM [id number]. Topic: Clinical - Lab/Test Results >> Feb 02, 2024  9:45 AM Tonda B wrote: Reason for CRM: patient calling needs test results please call pt back [phone] (M) [phone] (W)

## 2024-02-02 NOTE — Telephone Encounter (Signed)
 Hormone panel not fully back yet. Thyroid  levels normal.

## 2024-02-09 ENCOUNTER — Telehealth: Payer: Self-pay | Admitting: Family Medicine

## 2024-02-09 NOTE — Telephone Encounter (Signed)
 Copied from CRM [id number]. Topic: Clinical - Lab/Test Results >> Feb 09, 2024  2:48 PM Kara Irwin wrote: Reason for CRM: Patient would like a call back regarding hormone panel//Please call to advise//

## 2024-02-12 NOTE — Telephone Encounter (Signed)
 Results are not back- will contact patient once they are resulted and reviewed by provider.

## 2024-02-16 LAB — HORMONE PANEL (T4,TSH,FSH,TESTT,SHBG,DHEA,ETC)
DHEA-Sulfate, LCMS: 248 ug/dL
Estradiol, Serum, MS: 50 pg/mL
Estrone Sulfate: 239 ng/dL
Follicle Stimulating Hormone: 11 m[IU]/mL
Free T-3: 3.1 pg/mL
Free Testosterone, Serum: 6.6 pg/mL — ABNORMAL HIGH
Progesterone, Serum: 27 ng/dL
Sex Hormone Binding Globulin: 25.8 nmol/L
T4: 10.3 ug/dL
TSH: 2 uU/mL
Testosterone, Serum (Total): 33 ng/dL
Testosterone-% Free: 2 %
Triiodothyronine (T-3), Serum: 104 ng/dL

## 2024-02-19 ENCOUNTER — Ambulatory Visit: Payer: Self-pay

## 2024-02-19 NOTE — Telephone Encounter (Signed)
 FYI Only or Action Required?: FYI only for provider.  Patient was last seen in primary care on 01/17/2024 by Severa Rock HERO, FNP.  Called Nurse Triage reporting Lab Result Questions.  Symptoms began n/a.  Interventions attempted: Other: n/a.  Symptoms are: n/a.  Triage Disposition: Information or Advice Only Call  Patient/caregiver understands and will follow disposition?: Yes Reason for Disposition  Health information question, no triage required and triager able to answer question  Answer Assessment - Initial Assessment Questions Patient stated she plugged in her lab results and it stated it could be PCOS and was wondering if that is a confirmed diagnosis. I stated if a provider as not diagnosed you with PCOS based off lab results, then no.. Patient was made aware the only out of range marker on results was Testosterone and to f/u with provider regarding high testosterone. Patient stated she will call back to schedule.     1. REASON FOR CALL: What is the main reason for your call? or How can I best help you?     Wondering if her hormone panel is a confirmed diagnosis of PCOS  Protocols used: Information Only Call - No Triage-A-AH  Copied from CRM [id number]. Topic: Clinical - Lab/Test Results >> Feb 19, 2024 11:36 AM Larissa RAMAN wrote: Reason for CRM: Patient has questions regarding lab results

## 2024-02-26 ENCOUNTER — Telehealth: Payer: Self-pay | Admitting: Family Medicine

## 2024-02-26 NOTE — Telephone Encounter (Signed)
 Copied from CRM [id number]. Topic: General - Other >> Feb 26, 2024  3:20 PM Deaijah H wrote: Reason for CRM: Patient called in stating she needs [place] [employer] removed because it is billing incorrect insurance. Current insurance is [employer] information centre [employer] [id number] (did update but cannot tell if system has it showing)
